# Patient Record
Sex: Female | Born: 1960 | Race: White | Hispanic: No | Marital: Married | Smoking: Former smoker
Health system: Southern US, Community
[De-identification: ages and names within clinical notes are randomized; demographics above are authoritative.]

## PROBLEM LIST (undated history)

## (undated) DIAGNOSIS — I4719 Other supraventricular tachycardia: Secondary | ICD-10-CM

## (undated) DIAGNOSIS — F32A Depression, unspecified: Secondary | ICD-10-CM

## (undated) DIAGNOSIS — R011 Cardiac murmur, unspecified: Secondary | ICD-10-CM

## (undated) DIAGNOSIS — Z9221 Personal history of antineoplastic chemotherapy: Secondary | ICD-10-CM

## (undated) DIAGNOSIS — I493 Ventricular premature depolarization: Secondary | ICD-10-CM

## (undated) DIAGNOSIS — I10 Essential (primary) hypertension: Secondary | ICD-10-CM

## (undated) DIAGNOSIS — Z923 Personal history of irradiation: Secondary | ICD-10-CM

## (undated) DIAGNOSIS — F329 Major depressive disorder, single episode, unspecified: Secondary | ICD-10-CM

## (undated) DIAGNOSIS — I471 Supraventricular tachycardia: Secondary | ICD-10-CM

## (undated) HISTORY — DX: Supraventricular tachycardia: I47.1

## (undated) HISTORY — DX: Major depressive disorder, single episode, unspecified: F32.9

## (undated) HISTORY — DX: Ventricular premature depolarization: I49.3

## (undated) HISTORY — DX: Other supraventricular tachycardia: I47.19

## (undated) HISTORY — DX: Essential (primary) hypertension: I10

## (undated) HISTORY — DX: Cardiac murmur, unspecified: R01.1

## (undated) HISTORY — DX: Depression, unspecified: F32.A

## (undated) HISTORY — PX: BREAST EXCISIONAL BIOPSY: SUR124

---

## 2010-08-01 ENCOUNTER — Other Ambulatory Visit: Admission: RE | Admit: 2010-08-01 | Discharge: 2010-08-01 | Payer: Self-pay | Admitting: Family Medicine

## 2011-10-08 ENCOUNTER — Other Ambulatory Visit (HOSPITAL_COMMUNITY)
Admission: RE | Admit: 2011-10-08 | Discharge: 2011-10-08 | Disposition: A | Discharge status: Home / Self Care | Payer: BC Managed Care – PPO | Source: Ambulatory Visit | Attending: Obstetrics and Gynecology | Admitting: Obstetrics and Gynecology

## 2011-10-08 DIAGNOSIS — Z01419 Encounter for gynecological examination (general) (routine) without abnormal findings: Secondary | ICD-10-CM | POA: Insufficient documentation

## 2013-03-28 ENCOUNTER — Other Ambulatory Visit: Payer: Self-pay | Admitting: Obstetrics and Gynecology

## 2013-03-28 ENCOUNTER — Other Ambulatory Visit (HOSPITAL_COMMUNITY)
Admission: RE | Admit: 2013-03-28 | Discharge: 2013-03-28 | Disposition: A | Discharge status: Home / Self Care | Payer: BC Managed Care – PPO | Source: Ambulatory Visit | Attending: Obstetrics and Gynecology | Admitting: Obstetrics and Gynecology

## 2013-03-28 DIAGNOSIS — Z1151 Encounter for screening for human papillomavirus (HPV): Secondary | ICD-10-CM | POA: Insufficient documentation

## 2013-03-28 DIAGNOSIS — Z01419 Encounter for gynecological examination (general) (routine) without abnormal findings: Secondary | ICD-10-CM | POA: Insufficient documentation

## 2013-10-04 ENCOUNTER — Encounter: Payer: Self-pay | Admitting: General Surgery

## 2013-10-04 DIAGNOSIS — I471 Supraventricular tachycardia: Secondary | ICD-10-CM

## 2013-10-04 DIAGNOSIS — I493 Ventricular premature depolarization: Secondary | ICD-10-CM

## 2013-10-04 DIAGNOSIS — I4719 Other supraventricular tachycardia: Secondary | ICD-10-CM

## 2013-10-04 DIAGNOSIS — I10 Essential (primary) hypertension: Secondary | ICD-10-CM

## 2013-10-05 ENCOUNTER — Encounter: Payer: Self-pay | Admitting: Cardiology

## 2013-10-05 ENCOUNTER — Ambulatory Visit: Payer: BC Managed Care – PPO | Admitting: Cardiology

## 2013-10-05 ENCOUNTER — Other Ambulatory Visit: Payer: Self-pay | Admitting: Dermatology

## 2013-10-27 ENCOUNTER — Other Ambulatory Visit: Payer: Self-pay | Admitting: Obstetrics and Gynecology

## 2014-01-15 ENCOUNTER — Other Ambulatory Visit: Payer: Self-pay | Admitting: *Deleted

## 2014-01-15 ENCOUNTER — Telehealth: Payer: Self-pay | Admitting: *Deleted

## 2014-01-15 MED ORDER — AMLODIPINE BESYLATE 5 MG PO TABS: 5.0000 mg | ORAL_TABLET | Freq: Every day | ORAL | Status: DC

## 2014-01-15 NOTE — Telephone Encounter (Signed)
Jodi Hoover is having pt schedule an appt then she will send in a 30 day supply for pt.

## 2014-01-15 NOTE — Telephone Encounter (Signed)
LVM for pt to return call

## 2014-01-15 NOTE — Telephone Encounter (Signed)
Patient [facility], she would also like a 14 day supply sent to [facility] on [address]. Thanks, MI

## 2014-01-29 ENCOUNTER — Encounter: Payer: Self-pay | Admitting: Cardiology

## 2014-01-29 ENCOUNTER — Ambulatory Visit (INDEPENDENT_AMBULATORY_CARE_PROVIDER_SITE_OTHER): Payer: BC Managed Care – PPO | Admitting: Cardiology

## 2014-01-29 VITALS — BP 104/70 | HR 55 | Ht 62.0 in | Wt 184.0 lb

## 2014-01-29 DIAGNOSIS — I493 Ventricular premature depolarization: Secondary | ICD-10-CM

## 2014-01-29 DIAGNOSIS — I4949 Other premature depolarization: Secondary | ICD-10-CM

## 2014-01-29 DIAGNOSIS — I471 Supraventricular tachycardia: Secondary | ICD-10-CM

## 2014-01-29 DIAGNOSIS — I10 Essential (primary) hypertension: Secondary | ICD-10-CM

## 2014-01-29 MED ORDER — NEBIVOLOL HCL 2.5 MG PO TABS: 2.5000 mg | ORAL_TABLET | Freq: Every day | ORAL | Status: DC

## 2014-01-29 MED ORDER — AMLODIPINE BESYLATE 5 MG PO TABS: 5.0000 mg | ORAL_TABLET | Freq: Every day | ORAL | Status: DC

## 2014-01-29 NOTE — Progress Notes (Signed)
  [place], [address], [place]  [address] Phone: [phone] Fax:  [phone]  Date:  01/29/2014   ID:  Jodi Hoover, DOB Oct 26, 1961, MRN [id number]  PCP:  Antony Blackbird, MD  Cardiologist:  Fransico Him, MD     History of Present Illness: Jodi Hoover is a 53 y.o. female with a history of HTN, PVC's and nonsustained atrial tachycardia.  She is doing well. She denies any chest pain, SOB, DOE, LE edema, dizziness or syncope.  Rarely she will notice a skipped beat.   Wt Readings from Last 3 Encounters:  01/29/14 184 lb (83.462 kg)  10/04/13 178 lb (80.74 kg)     Past Medical History  Diagnosis Date  . Heart murmur   . Depression   . Hypertension   . Atrial tachycardia, paroxysmal   . PVC's (premature ventricular contractions)     Current Outpatient Prescriptions  Medication Sig Dispense Refill  . amLODipine (NORVASC) 5 MG tablet Take 1 tablet (5 mg total) by mouth daily.  30 tablet  0  . BYSTOLIC 2.5 MG tablet       . Calcium-Magnesium-Vitamin D (CALCIUM 500 PO) Take 1 tablet by mouth daily.      . DULoxetine (CYMBALTA) 30 MG capsule Take 30 mg by mouth daily.      . Linaclotide (LINZESS) 145 MCG CAPS capsule Take 145 mcg by mouth daily.      Marland Kitchen loratadine-pseudoephedrine (CLARITIN-D 12-HOUR) 5-120 MG per tablet Take 1 tablet by mouth as needed for allergies.       No current facility-administered medications for this visit.    Allergies:   No Known Allergies  Social History:  The patient  reports that she has been smoking.  She has never used smokeless tobacco. She reports that she drinks alcohol. She reports that she does not use illicit drugs.   Family History:  The patient's family history is not on file.   ROS:  Please see the history of present illness.      All other systems reviewed and negative.   PHYSICAL EXAM: VS:  BP 104/70  Pulse 55  Ht 5\' 2"  (1.575 m)  Wt 184 lb (83.462 kg)  BMI 33.65 kg/m2 Well nourished, well developed, in no acute  distress HEENT: normal Neck: no JVD Cardiac:  normal S1, S2; RRR; no murmur Lungs:  clear to auscultation bilaterally, no wheezing, rhonchi or rales Abd: soft, nontender, no hepatomegaly Ext: no edema Skin: warm and dry Neuro:  CNs 2-12 intact, no focal abnormalities noted  EKG:  sinus bradycardia with normal intervals      ASSESSMENT AND PLAN:  1. Nonsustained atrial tachycardia - no further epidsodes 2. HTN - well controlled - continue Bystolic/amlodipine 3. PVC's - asymptomatic - continue bystolic  Followup with me in 1 year  Signed, Fransico Him, MD 01/29/2014 10:49 AM

## 2014-01-29 NOTE — Patient Instructions (Signed)
Your physician recommends that you continue on your current medications as directed. Please refer to the Current Medication list given to you today.  Your Bystolic and [facility] We gave you a 90 day supply with 3 refills  Your physician wants you to follow-up in: 1 Year with Dr Mallie Snooks will receive a reminder letter in the mail two months in advance. If you don't receive a letter, please call our office to schedule the follow-up appointment.

## 2014-04-05 ENCOUNTER — Other Ambulatory Visit: Payer: Self-pay | Admitting: Obstetrics and Gynecology

## 2014-04-05 DIAGNOSIS — N6009 Solitary cyst of unspecified breast: Secondary | ICD-10-CM

## 2014-04-16 ENCOUNTER — Other Ambulatory Visit: Payer: BC Managed Care – PPO

## 2014-06-11 ENCOUNTER — Ambulatory Visit
Admission: RE | Admit: 2014-06-11 | Discharge: 2014-06-11 | Disposition: A | Discharge status: Home / Self Care | Payer: BC Managed Care – PPO | Source: Ambulatory Visit | Attending: Obstetrics and Gynecology | Admitting: Obstetrics and Gynecology

## 2014-06-11 ENCOUNTER — Other Ambulatory Visit: Payer: Self-pay | Admitting: Obstetrics and Gynecology

## 2014-06-11 DIAGNOSIS — N6009 Solitary cyst of unspecified breast: Secondary | ICD-10-CM

## 2014-06-12 ENCOUNTER — Other Ambulatory Visit: Payer: Self-pay | Admitting: Obstetrics and Gynecology

## 2014-06-12 DIAGNOSIS — N6009 Solitary cyst of unspecified breast: Secondary | ICD-10-CM

## 2014-06-12 DIAGNOSIS — R2232 Localized swelling, mass and lump, left upper limb: Secondary | ICD-10-CM

## 2014-06-13 ENCOUNTER — Ambulatory Visit
Admission: RE | Admit: 2014-06-13 | Discharge: 2014-06-13 | Disposition: A | Discharge status: Home / Self Care | Payer: BC Managed Care – PPO | Source: Ambulatory Visit | Attending: Obstetrics and Gynecology | Admitting: Obstetrics and Gynecology

## 2014-06-13 DIAGNOSIS — R2232 Localized swelling, mass and lump, left upper limb: Secondary | ICD-10-CM

## 2014-06-13 DIAGNOSIS — N6009 Solitary cyst of unspecified breast: Secondary | ICD-10-CM

## 2014-08-02 HISTORY — PX: OTHER SURGICAL HISTORY: SHX169

## 2014-08-02 HISTORY — PX: BREAST LUMPECTOMY: SHX2

## 2014-11-12 ENCOUNTER — Encounter: Payer: Self-pay | Admitting: Cardiology

## 2014-12-25 ENCOUNTER — Other Ambulatory Visit: Payer: Self-pay | Admitting: Cardiology

## 2015-01-08 ENCOUNTER — Encounter: Payer: Self-pay | Admitting: Cardiology

## 2015-02-04 ENCOUNTER — Other Ambulatory Visit: Payer: Self-pay | Admitting: Cardiology

## 2015-03-25 ENCOUNTER — Other Ambulatory Visit: Payer: Self-pay | Admitting: Cardiology

## 2015-03-26 NOTE — Telephone Encounter (Signed)
per note 3.30.15

## 2015-06-23 ENCOUNTER — Other Ambulatory Visit: Payer: Self-pay | Admitting: Cardiology

## 2016-12-21 ENCOUNTER — Encounter: Payer: Self-pay | Admitting: Hematology

## 2016-12-21 ENCOUNTER — Telehealth: Payer: Self-pay | Admitting: Hematology

## 2016-12-21 NOTE — Telephone Encounter (Signed)
Appt scheduled fo rthe pt to see Dr. Burr Medico on 3/13 at 11am. Pt aware to arrive 30 minutes early. Demographics verified. Letter mailed to the pt.

## 2017-01-06 NOTE — Progress Notes (Signed)
Jodi Hoover  [phone] [phone] Note   Patient Care Team: Antony Blackbird, MD as PCP - General (Family Medicine) 01/12/2017  CHIEF COMPLAINTS/PURPOSE OF CONSULTATION:  Left breast cancer  Oncology History   Cancer Staging Cancer of central portion of left female breast [facility]) Staging form: Breast, AJCC 8th Edition - Pathologic stage from 08/22/2014: Stage IA (pT1b, pN1b, cM0, G1, ER: Positive, PR: Positive, HER2: Negative) - Signed by Truitt Merle, MD on 01/12/2017 - Clinical: No stage assigned - Unsigned       Cancer of central portion of left female breast ([place])   06/11/2014 Mammogram    Bilateral diagnostic mammogram on 06/11/2014 showed a obscured spiculated 2.4 cm mass in the 3:00 position of the left breast. Ultrasound showed a 1.4 x 1.0 x 1.3 cm irregular hypoechoic mass with shadowing in the left breast at the 3:00 position 6 cm from the nipple. Sonographic evaluation the left axilla showed 2 lymph nodes with cortical thickening. The larger lymph node measured 0.8 cm.      06/13/2014 Initial Diagnosis    Cancer of central portion of left female breast ([place])      06/13/2014 Initial Biopsy    Left breast 3:00 biopsy showed invasive tubular carcinoma, grade 1, low-grade DCIS, left axillary lymph node biopsy negative for malignancy.      06/13/2014 Receptors her2    ER and 96% are strongly positive, PR 12% strong positive, HER-2 not amplified, Ki67 4%.      08/22/2014 Surgery    Left breast lumpectomy and sentinel lymph node biopsy       08/22/2014 Pathology Results    Left breast lumpectomy showed invasive tubular carcinoma, 0.8 cm, grade 1, margins were negative, DCIS, intermediate grade. 2 out of 7 sentinel lymph nodes had metastatic carcinoma, tumor deposit measuring 2-5 mm.        - 11/26/2014 Adjuvant Chemotherapy    On 11/26/2014, the patient completed her 4th cycle of Taxotere and Cytoxan chemotherapy.       12/28/2014 - 02/11/2015 Radiation Therapy    From 12/28/14 - 02/11/15, the patient completed radiation with 50.4 Gy in 28 fractions with a boost of 10 Gy in 5 fractions for a total dose of 60.4 Gy in 33 fractions to the right breast and axilla.      02/25/2015 - 09/18/2015 Anti-estrogen oral therapy    From 02/25/15 - 09/18/15, the patient was on AI therapy with Letrozole by Dr. Sheran Lawless. Discontinued due to intolerable joint pain.      09/18/2015 - 02/24/2016 Anti-estrogen oral therapy    On 09/18/15, the patient was switched to Exemestane due to intolerable joint pain on Letrozole.      02/24/2016 - 03/25/2016 Anti-estrogen oral therapy    Exemestane changed to Tamoxifen due to tolerance issues. Tamoxifen discontinued after 1 month due to continuing joint pain, fatigue, and mood changes.      03/25/2016 -  Anti-estrogen oral therapy    Tamoxifen discontinued due to continuing joint pain, fatigue, and mood changes. The patient was changed back to Exemestane.       HISTORY OF PRESENTING ILLNESS:  Jodi Hoover 56 y.o. female is here to establish care concerning her history of left breast cancer. She was treated at the [facility], [place] and wishes to transfer her oncology care to [place] due to her relocation. .  The patient had a palpable left breast mass by her OB-GYN in August 2015. Bilateral  diagnostic mammogram on 06/11/2014 showed a obscured spiculated 2.4 cm mass in the 3:00 position of the left breast. On physical exam, there was palpable discrete thickening in the left breast 3:00 position 6 cm from the nipple. Ultrasound at the time showed a 1.4 x 1.0 x 1.3 cm irregular hypoechoic mass with shadowing in the left breast at the 3:00 position 6 cm from the nipple. Sonographic evaluation the left axilla showed 2 lymph nodes with cortical thickening. The larger lymph node measured 0.8 cm. Biopsies were performed on 06/13/2014. Biopsy of the left breast mass revealed grade  1 IDC and DCIS with necrosis and calcifications (ER96% +, PR 12% +, HER2 -, Ki67 4%). Biopsy of a left axillary lymph node was negative for carcinoma.  The patient underwent left breast lumpectomy and sentinel lymph node biopsy on 08/22/14. Lumpectomy revealed grade 1 IDC measuring 0.8 cm, the tumor was within 0.1 cm from the superficial anterior resection margin, intermedaite grade DCIS. Biopsy of 2/7 left axillary sentinel lymph nodes were positive. pT1b, pN1a  On 11/26/2014, the patient completed her 4th cycle of Taxotere and Cytoxan chemotherapy. From 12/28/14 - 02/11/15, the patient completed radiation with 50.4 Gy in 28 fractions with a boost of 10 Gy in 5 fractions for a total dose of 60.4 Gy in 33 fractions to the right breast and axilla.  From 02/25/15 - 09/18/15, the patient was on AI therapy with Letrozole by Dr. Sheran Lawless. On 09/18/15, the patient was switched to Exemestane due to intolerable joint pain on Letrozole. The patient was switched to Tamoxifen on 02/24/16 due to poor tolerance. The patient's pain continued and the patient experienced mood swings on Tamoxifen and she stopped Tamoxifen after being on it for a month. The patient was placed back on Exemestane due to the side effects being less severe. The patient reports her last mammogram was in April 2017.  The patient has a IUD implanted due to heavy bleeding in 2007 and did not have a menstural cycle since. The patient had her IUD removed in 2013 and she had hormone replacement. She no longer has hormone replacement after her cancer diagnosis.  The patient has fatigue, joint pain (should, hips, knees, back), and hot flashes. She wakes up stiff and it takes time for her to loosen up. The patient states she is pre-diabetic with her sugar hovering around 100. She also reports a heart murmur and HTN.  CURRENT THERAPY: Exemestane 25 mg 1 tablet daily since May 2017 (initially started in Letrozole in 02/2015)   MEDICAL HISTORY:  Past  Medical History:  Diagnosis Date  . Atrial tachycardia, paroxysmal ([place])   . Depression   . Heart murmur   . Hypertension   . PVC's (premature ventricular contractions)     SURGICAL HISTORY: Past Surgical History:  Procedure Laterality Date  . left breast lumpectomy  08/2014    SOCIAL HISTORY: Social History   Social History  . Marital status: Married    Spouse name: N/A  . Number of children: N/A  . Years of education: N/A   Occupational History  . Not on file.   Social History Main Topics  . Smoking status: Current Every Day Smoker    Packs/day: 1.00    Years: 16.00  . Smokeless tobacco: Never Used  . Alcohol use Yes     Comment: ocassionally wine  . Drug use: No  . Sexual activity: Yes    Birth control/ protection: IUD   Other Topics Concern  . Not on  file   Social History Narrative  . No narrative on file    FAMILY HISTORY: Family History  Problem Relation Age of Onset  . Hypertension Mother   . Cancer Paternal Aunt     lung cancer    ALLERGIES:  is allergic to other.  MEDICATIONS:  Current Outpatient Prescriptions  Medication Sig Dispense Refill  . amLODipine (NORVASC) 5 MG tablet TAKE 1 TABLET DAILY (NEED TO CALL AND SCHEDULE AN APPOINTMENT) 30 tablet 0  . BYSTOLIC 2.5 MG tablet TAKE 1 TABLET DAILY 90 tablet 0  . Calcium-Magnesium-Vitamin D (CALCIUM 500 PO) Take 1 tablet by mouth daily.    Marland Kitchen exemestane (AROMASIN) 25 MG tablet Take 25 mg by mouth daily after breakfast.    . gabapentin (NEURONTIN) 600 MG tablet Take 600 mg by mouth 3 (three) times daily.    Marland Kitchen loratadine-pseudoephedrine (CLARITIN-D 12-HOUR) 5-120 MG per tablet Take 1 tablet by mouth as needed for allergies.    Marland Kitchen LOTEMAX 0.5 % GEL PUT 1 DROP INTO BOTH EYES 4 TIMES A DAY  0  . metFORMIN (GLUCOPHAGE) 500 MG tablet Take 500 mg by mouth 2 (two) times daily with a meal.    . Methylsulfonylmethane (MSM) 1000 MG CAPS Take 2,000 mg by mouth daily.    . RESTASIS MULTIDOSE 0.05 % ophthalmic  emulsion INSTILL 1 DROP INTO BOTH EYES TWICE A DAY AS DIRECTED  3  . traMADol (ULTRAM) 50 MG tablet Take 1 tablet (50 mg total) by mouth as needed. 30 tablet 0  . vitamin B-12 (CYANOCOBALAMIN) 1000 MCG tablet Take 1,000 mcg by mouth daily.     No current facility-administered medications for this visit.     REVIEW OF SYSTEMS:   Constitutional: Denies fevers, chills (+) Hot flashes (+) fatigue Eyes: Denies blurriness of vision, double vision or watery eyes Ears, nose, mouth, throat, and face: Denies mucositis or sore throat Respiratory: Denies cough, dyspnea or wheezes Cardiovascular: Denies palpitation, chest discomfort or lower extremity swelling Gastrointestinal:  Denies nausea, heartburn or change in bowel habits Skin: Denies abnormal skin rashes Lymphatics: Denies new lymphadenopathy or easy bruising Neurological:Denies numbness, tingling or new weaknesses MSK (+) Joint pain in her knees, back, hips, and shoulders. Behavioral/Psych: Mood is stable, no new changes  All other systems were reviewed with the patient and are negative.  PHYSICAL EXAMINATION: ECOG PERFORMANCE STATUS: 0 - Asymptomatic  Vitals:   01/12/17 1117  BP: 139/77  Pulse: (!) 57  Resp: 18  Temp: 98.4 F (36.9 C)   Filed Weights   01/12/17 1117  Weight: 173 lb 6.4 oz (78.7 kg)    GENERAL:alert, no distress and comfortable SKIN: skin color, texture, turgor are normal, no rashes or significant lesions EYES: normal, conjunctiva are pink and non-injected, sclera clear OROPHARYNX:no exudate, no erythema and lips, buccal mucosa, and tongue normal  NECK: supple, thyroid normal size, non-tender, without nodularity LYMPH:  no palpable lymphadenopathy in the cervical chains LUNGS: clear to auscultation and percussion with normal breathing effort HEART: regular rate & rhythm and no murmurs and no lower extremity edema ABDOMEN:abdomen soft, non-tender and normal bowel sounds Musculoskeletal:no cyanosis of digits  and no clubbing  PSYCH: alert & oriented x 3 with fluent speech NEURO: no focal motor/sensory deficits BREAST: (+) Surgical scar in left axilla and left breast. 1.5 cm left axillary lymph node moveable, non-tender. Right breast exam was negative.  LABORATORY DATA:  I have reviewed the data as listed CBC Latest Ref Rng & Units 01/12/2017  WBC 3.9 -  10.3 10e3/uL 6.9  Hemoglobin 11.6 - 15.9 g/dL 13.1  Hematocrit 34.8 - 46.6 % 39.2  Platelets 145 - 400 10e3/uL 252   CMP Latest Ref Rng & Units 01/12/2017  Glucose 70 - 140 mg/dl 88  BUN 7.0 - 26.0 mg/dL 12.4  Creatinine 0.6 - 1.1 mg/dL 0.8  Sodium 136 - 145 mEq/L 142  Potassium 3.5 - 5.1 mEq/L 4.6  CO2 22 - 29 mEq/L 26  Calcium 8.4 - 10.4 mg/dL 9.8  Total Protein 6.4 - 8.3 g/dL 7.5  Total Bilirubin 0.20 - 1.20 mg/dL 0.28  Alkaline Phos 40 - 150 U/L 79  AST 5 - 34 U/L 19  ALT 0 - 55 U/L 18     RADIOGRAPHIC STUDIES: I have personally reviewed the radiological images as listed and agreed with the findings in the report. No results found.  ASSESSMENT & PLAN: 56 y.o. post-menopausal Caucasian female with a palpable detected left breast cancer  1. 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  -The patient has completed surgery (left lumpectomy and SLN biopsy), adjuvant chemotherapy, and adjuvant radiation as part of her cancer treatment in 2016. -We discussed her risk of recurrence. She had Oncotype test done in 2016, recurrence score was 26, intermediate risk. -The patient has been on anti-estrogen medication from 02/25/15 consisting of Letrozole -> Exemestane -> Tamoxifen -> Exemestane.   -The patient has been on multiple medications due to poor tolerance. She is currently on Exemestane 25 mg 1 tablet daily. The patient has hot flashes and mild  joint pain on this medication, overall manageable  -I strongly encouraged her to Continue Exemestane 25 mg 1 tablet daily. Total antiestrogen for 5-7 years -She is clinically doing well, exam was unremarkable, except a small movable lymph node in the left axilla. -The patient's last screening mammogram was in April 2017. We will schedule her next mammogram in April 2018, I'll also order a left breast and axilla [place] to evaluate her adenopathy. -We'll repeat her labs including CBC and CMP today.  2. Smoking -I consulted the patient about smoking cessation. -She is interested in quitting, she has tried various methods to quit, was not successful.  3. Hyperglycemia, HTN -Continue close monitoring at home. -Advised the patient to eat well and nutritious food. -To be managed by PCP.  4. Joint Pain -I refilled the patient's Tramadol today.  5. Bone health  -Pt will try to find her last DEXA scan  -We discussed the potential side effects of osteoporosis from exemestane -We'll monitor her bone density scan every 2 years -I encouraged her to take calcium and vitamin D   PLAN -Refilled Tramadol. -Labs today. -Screening mammogram and [place] of left breast and axilla in April 2017. -F/u and labs in 6 months.  Orders Placed This Encounter  Procedures  . MM DIAG BREAST TOMO BILATERAL    Standing Status:   Future    Standing Expiration Date:   03/14/2018    Order Specific Question:   Reason for Exam (SYMPTOM  OR DIAGNOSIS REQUIRED)    Answer:   screening    Order Specific Question:   Is the patient pregnant?    Answer:   No    Order Specific Question:   Preferred imaging location?    Answer:   [facility]  . CBC with Differential  Standing Status:   Standing    Number of Occurrences:   30    Standing Expiration Date:   01/12/2022  . Comprehensive metabolic panel    Standing Status:   Standing    Number of Occurrences:   30    Standing Expiration Date:   01/12/2022  . Vitamin D 25  hydroxy    Standing Status:   Standing    Number of Occurrences:   10    Standing Expiration Date:   01/12/2018    All questions were answered. The patient knows to call the clinic with any problems, questions or concerns. I spent 40 minutes counseling the patient face to face. The total time spent in the appointment was 45 minutes and more than 50% was on counseling.     Truitt Merle, MD 01/12/2017   This document serves as a record of services personally performed by Truitt Merle, MD. It was created on her behalf by Darcus Austin, a trained medical scribe. The creation of this record is based on the scribe's personal observations and the provider's statements to them. This document has been checked and approved by the attending provider.

## 2017-01-12 ENCOUNTER — Ambulatory Visit (HOSPITAL_BASED_OUTPATIENT_CLINIC_OR_DEPARTMENT_OTHER): Payer: BLUE CROSS/BLUE SHIELD | Admitting: Hematology

## 2017-01-12 ENCOUNTER — Ambulatory Visit (HOSPITAL_BASED_OUTPATIENT_CLINIC_OR_DEPARTMENT_OTHER): Payer: BLUE CROSS/BLUE SHIELD

## 2017-01-12 ENCOUNTER — Telehealth: Payer: Self-pay | Admitting: Hematology

## 2017-01-12 ENCOUNTER — Encounter: Payer: Self-pay | Admitting: Hematology

## 2017-01-12 DIAGNOSIS — Z720 Tobacco use: Secondary | ICD-10-CM

## 2017-01-12 DIAGNOSIS — I10 Essential (primary) hypertension: Secondary | ICD-10-CM

## 2017-01-12 DIAGNOSIS — Z170 Estrogen receptor positive status [ER+]: Secondary | ICD-10-CM | POA: Diagnosis not present

## 2017-01-12 DIAGNOSIS — C50112 Malignant neoplasm of central portion of left female breast: Secondary | ICD-10-CM | POA: Diagnosis not present

## 2017-01-12 DIAGNOSIS — R739 Hyperglycemia, unspecified: Secondary | ICD-10-CM

## 2017-01-12 LAB — CBC WITH DIFFERENTIAL/PLATELET
BASO%: 1 % (ref 0.0–2.0)
Basophils Absolute: 0.1 10*3/uL (ref 0.0–0.1)
EOS%: 7.4 % — ABNORMAL HIGH (ref 0.0–7.0)
Eosinophils Absolute: 0.5 10*3/uL (ref 0.0–0.5)
HEMATOCRIT: 39.2 % (ref 34.8–46.6)
HGB: 13.1 g/dL (ref 11.6–15.9)
LYMPH#: 2.3 10*3/uL (ref 0.9–3.3)
LYMPH%: 34 % (ref 14.0–49.7)
MCH: 31.3 pg (ref 25.1–34.0)
MCHC: 33.5 g/dL (ref 31.5–36.0)
MCV: 93.6 fL (ref 79.5–101.0)
MONO#: 0.7 10*3/uL (ref 0.1–0.9)
MONO%: 10.6 % (ref 0.0–14.0)
NEUT%: 47 % (ref 38.4–76.8)
NEUTROS ABS: 3.2 10*3/uL (ref 1.5–6.5)
Platelets: 252 10*3/uL (ref 145–400)
RBC: 4.19 10*6/uL (ref 3.70–5.45)
RDW: 12.8 % (ref 11.2–14.5)
WBC: 6.9 10*3/uL (ref 3.9–10.3)

## 2017-01-12 LAB — COMPREHENSIVE METABOLIC PANEL
ALT: 18 U/L (ref 0–55)
ANION GAP: 8 meq/L (ref 3–11)
AST: 19 U/L (ref 5–34)
Albumin: 4.3 g/dL (ref 3.5–5.0)
Alkaline Phosphatase: 79 U/L (ref 40–150)
BILIRUBIN TOTAL: 0.28 mg/dL (ref 0.20–1.20)
BUN: 12.4 mg/dL (ref 7.0–26.0)
CALCIUM: 9.8 mg/dL (ref 8.4–10.4)
CO2: 26 meq/L (ref 22–29)
CREATININE: 0.8 mg/dL (ref 0.6–1.1)
Chloride: 109 mEq/L (ref 98–109)
EGFR: 80 mL/min/{1.73_m2} — AB (ref >90)
Glucose: 88 mg/dl (ref 70–140)
Potassium: 4.6 mEq/L (ref 3.5–5.1)
Sodium: 142 mEq/L (ref 136–145)
TOTAL PROTEIN: 7.5 g/dL (ref 6.4–8.3)

## 2017-01-12 MED ORDER — TRAMADOL HCL 50 MG PO TABS: 50.0000 mg | ORAL_TABLET | ORAL | 0 refills | Status: DC | PRN

## 2017-01-12 NOTE — Telephone Encounter (Signed)
Lab appointment added for today, per 01/12/17 los. Follow up appointments scheduled per 01/12/17 los.Per patient, she will call GI to schedule her Mammogram, before 01/26/17. Patient was given a copy of the AVS report and appointment schedule per 01/12/17 los.

## 2017-01-13 ENCOUNTER — Encounter: Payer: Self-pay | Admitting: Hematology

## 2017-01-13 LAB — VITAMIN D 25 HYDROXY (VIT D DEFICIENCY, FRACTURES): Vitamin D, 25-Hydroxy: 52.3 ng/mL (ref 30.0–100.0)

## 2017-01-14 NOTE — Addendum Note (Signed)
Addended by: Truitt Merle on: 01/14/2017 05:03 PM   Modules accepted: Orders

## 2017-01-19 ENCOUNTER — Other Ambulatory Visit: Payer: Self-pay | Admitting: Hematology

## 2017-01-19 DIAGNOSIS — N63 Unspecified lump in unspecified breast: Secondary | ICD-10-CM

## 2017-01-21 ENCOUNTER — Other Ambulatory Visit: Payer: Self-pay | Admitting: Hematology

## 2017-01-21 DIAGNOSIS — N63 Unspecified lump in unspecified breast: Secondary | ICD-10-CM

## 2017-01-21 DIAGNOSIS — Z853 Personal history of malignant neoplasm of breast: Secondary | ICD-10-CM

## 2017-01-22 ENCOUNTER — Other Ambulatory Visit: Payer: BLUE CROSS/BLUE SHIELD

## 2017-02-02 ENCOUNTER — Ambulatory Visit
Admission: RE | Admit: 2017-02-02 | Discharge: 2017-02-02 | Disposition: A | Discharge status: Home / Self Care | Payer: BLUE CROSS/BLUE SHIELD | Source: Ambulatory Visit | Attending: Hematology | Admitting: Hematology

## 2017-02-02 ENCOUNTER — Other Ambulatory Visit: Payer: Self-pay | Admitting: Hematology

## 2017-02-02 DIAGNOSIS — Z853 Personal history of malignant neoplasm of breast: Secondary | ICD-10-CM

## 2017-02-02 DIAGNOSIS — N63 Unspecified lump in unspecified breast: Secondary | ICD-10-CM

## 2017-05-25 ENCOUNTER — Telehealth: Payer: Self-pay | Admitting: *Deleted

## 2017-05-25 DIAGNOSIS — C50112 Malignant neoplasm of central portion of left female breast: Secondary | ICD-10-CM

## 2017-05-25 DIAGNOSIS — Z170 Estrogen receptor positive status [ER+]: Principal | ICD-10-CM

## 2017-05-25 MED ORDER — TRAMADOL HCL 50 MG PO TABS: 50.0000 mg | ORAL_TABLET | ORAL | 1 refills | Status: DC | PRN

## 2017-05-25 MED ORDER — EXEMESTANE 25 MG PO TABS: 25.0000 mg | ORAL_TABLET | Freq: Every day | ORAL | 0 refills | Status: DC

## 2017-05-25 MED ORDER — GABAPENTIN 600 MG PO TABS: 600.0000 mg | ORAL_TABLET | Freq: Three times a day (TID) | ORAL | 0 refills | Status: DC

## 2017-05-25 NOTE — Telephone Encounter (Signed)
I only saw her once, not sure how often she is taking tramadol. Please call pt to find out, I will maximum refill tramadol 50mg  q8h, but fill only the amount she is taking, thanks.   Truitt Merle MD

## 2017-05-25 NOTE — Telephone Encounter (Addendum)
"  Out of medicines and need refills.  Update my pharmacy to [facility].  [facility] $500 through [facility].  Need refills for exemestane, gabapentin and tramadol.  Use Tramadol for severe pain and need this too.  Tramadol can be sent to [facility] on [address] If [facility]'t [facility].  I'll pay for this.

## 2017-05-25 NOTE — Telephone Encounter (Signed)
Please refill these medications. Thanks   Truitt Merle MD

## 2017-05-25 NOTE — Telephone Encounter (Signed)
Refills sent.  See note for [facility]ol through [facility].  How many can used daily?  How many every sixty days?

## 2017-05-25 NOTE — Telephone Encounter (Signed)
[facility] reports "Tramadol can only be a sixty day supply with one refill.  All controlled substances must be faxed to [phone]."   Tramadol to [facility].)

## 2017-07-12 NOTE — Progress Notes (Signed)
[place]  [phone] [phone]  Clinic Follow-up Note   Patient Care Team: Antony Blackbird, MD as PCP - General (Family Medicine) 07/15/2017  CHIEF COMPLAINTS:  Left breast cancer  Oncology History   Cancer Staging Cancer of central portion of left female breast [facility]) Staging form: Breast, AJCC 8th Edition - Pathologic stage from 08/22/2014: Stage IA (pT1b, pN1b, cM0, G1, ER: Positive, PR: Positive, HER2: Negative) - Signed by Truitt Merle, MD on 01/12/2017 - Clinical: No stage assigned - Unsigned       Cancer of central portion of left female breast ([place])   06/11/2014 Mammogram    Bilateral diagnostic mammogram on 06/11/2014 showed a obscured spiculated 2.4 cm mass in the 3:00 position of the left breast. Ultrasound showed a 1.4 x 1.0 x 1.3 cm irregular hypoechoic mass with shadowing in the left breast at the 3:00 position 6 cm from the nipple. Sonographic evaluation the left axilla showed 2 lymph nodes with cortical thickening. The larger lymph node measured 0.8 cm.      06/13/2014 Initial Diagnosis    Cancer of central portion of left female breast ([place])      06/13/2014 Initial Biopsy    Left breast 3:00 biopsy showed invasive tubular carcinoma, grade 1, low-grade DCIS, left axillary lymph node biopsy negative for malignancy.      06/13/2014 Receptors her2    ER and 96% are strongly positive, PR 12% strong positive, HER-2 not amplified, Ki67 4%.      08/22/2014 Surgery    Left breast lumpectomy and sentinel lymph node biopsy       08/22/2014 Pathology Results    Left breast lumpectomy showed invasive tubular carcinoma, 0.8 cm, grade 1, margins were negative, DCIS, intermediate grade. 2 out of 7 sentinel lymph nodes had metastatic carcinoma, tumor deposit measuring 2-5 mm.        - 11/26/2014 Adjuvant Chemotherapy    On 11/26/2014, the patient completed her 4th cycle of Taxotere and Cytoxan chemotherapy.       12/28/2014 - 02/11/2015  Radiation Therapy    From 12/28/14 - 02/11/15, the patient completed radiation with 50.4 Gy in 28 fractions with a boost of 10 Gy in 5 fractions for a total dose of 60.4 Gy in 33 fractions to the right breast and axilla.      02/25/2015 - 09/18/2015 Anti-estrogen oral therapy    From 02/25/15 - 09/18/15, the patient was on AI therapy with Letrozole by Dr. Sheran Lawless. Discontinued due to intolerable joint pain.      09/18/2015 - 02/24/2016 Anti-estrogen oral therapy    On 09/18/15, the patient was switched to Exemestane due to intolerable joint pain on Letrozole.      02/24/2016 - 03/25/2016 Anti-estrogen oral therapy    Exemestane changed to Tamoxifen due to tolerance issues. Tamoxifen discontinued after 1 month due to continuing joint pain, fatigue, and mood changes.      03/25/2016 -  Anti-estrogen oral therapy    Tamoxifen discontinued due to continuing joint pain, fatigue, and mood changes. The patient was changed back to Exemestane.       HISTORY OF PRESENTING ILLNESS:  Jodi Hoover 56 y.o. female is here to establish care concerning her history of left breast cancer. She was treated at the [facility] at [facility], [place]), and wishes to transfer her oncology care to [place] due to her relocation. .  The patient had a palpable left breast mass by her OB-GYN in August 2015. Bilateral diagnostic mammogram  on 06/11/2014 showed a obscured spiculated 2.4 cm mass in the 3:00 position of the left breast. On physical exam, there was palpable discrete thickening in the left breast 3:00 position 6 cm from the nipple. Ultrasound at the time showed a 1.4 x 1.0 x 1.3 cm irregular hypoechoic mass with shadowing in the left breast at the 3:00 position 6 cm from the nipple. Sonographic evaluation the left axilla showed 2 lymph nodes with cortical thickening. The larger lymph node measured 0.8 cm. Biopsies were performed on 06/13/2014. Biopsy of the left breast mass revealed grade 1 IDC and DCIS with  necrosis and calcifications (ER96% +, PR 12% +, HER2 -, Ki67 4%). Biopsy of a left axillary lymph node was negative for carcinoma.  The patient underwent left breast lumpectomy and sentinel lymph node biopsy on 08/22/14. Lumpectomy revealed grade 1 IDC measuring 0.8 cm, the tumor was within 0.1 cm from the superficial anterior resection margin, intermedaite grade DCIS. Biopsy of 2/7 left axillary sentinel lymph nodes were positive. pT1b, pN1a  On 11/26/2014, the patient completed her 4th cycle of Taxotere and Cytoxan chemotherapy. From 12/28/14 - 02/11/15, the patient completed radiation with 50.4 Gy in 28 fractions with a boost of 10 Gy in 5 fractions for a total dose of 60.4 Gy in 33 fractions to the right breast and axilla.  From 02/25/15 - 09/18/15, the patient was on AI therapy with Letrozole by Dr. Sheran Lawless. On 09/18/15, the patient was switched to Exemestane due to intolerable joint pain on Letrozole. The patient was switched to Tamoxifen on 02/24/16 due to poor tolerance. The patient's pain continued and the patient experienced mood swings on Tamoxifen and she stopped Tamoxifen after being on it for a month. The patient was placed back on Exemestane due to the side effects being less severe. The patient reports her last mammogram was in April 2017.  The patient has a IUD implanted due to heavy bleeding in 2007 and did not have a menstural cycle since. The patient had her IUD removed in 2013 and she had hormone replacement. She no longer has hormone replacement after her cancer diagnosis.  The patient has fatigue, joint pain (should, hips, knees, back), and hot flashes. She wakes up stiff and it takes time for her to loosen up. The patient states she is pre-diabetic with her sugar hovering around 100. She also reports a heart murmur and HTN.  CURRENT THERAPY: Exemestane 25 mg 1 tablet daily since May 2017 (initially started in Letrozole in 02/2015)  Interim History: The patient returns for  routine follow up. She reports issues with hot flashes and joint pain since taking exemestane. She reports pain in the bottom of her feet, knees, and ankles which is worse in the morning. She tries to stay active however, the joint pain never resolves completely. The joint pain is worse on rainy weather days. She takes Tramadol as needed. She describes the joint pain as a 6-7 out of 10 on pain scale. She states the joint pain is better than when she first began aromatase inhibitors but figured it would go away with time. She has previously tried Tamoxifen and Letrozole. She performs self-breast exams. She denies any pain associated with the incision site.  MEDICAL HISTORY:  Past Medical History:  Diagnosis Date  . Atrial tachycardia, paroxysmal ([place])   . Depression   . Heart murmur   . Hypertension   . PVC's (premature ventricular contractions)     SURGICAL HISTORY: Past Surgical History:  Procedure Laterality  Date  . left breast lumpectomy  08/2014    SOCIAL HISTORY: Social History   Social History  . Marital status: Married    Spouse name: N/A  . Number of children: N/A  . Years of education: N/A   Occupational History  . Not on file.   Social History Main Topics  . Smoking status: Current Every Day Smoker    Packs/day: 1.00    Years: 16.00  . Smokeless tobacco: Never Used  . Alcohol use Yes     Comment: ocassionally wine  . Drug use: No  . Sexual activity: Yes    Birth control/ protection: IUD   Other Topics Concern  . Not on file   Social History Narrative  . No narrative on file    FAMILY HISTORY: Family History  Problem Relation Age of Onset  . Hypertension Mother   . Cancer Paternal Aunt        lung cancer    ALLERGIES:  is allergic to other.  MEDICATIONS:  Current Outpatient Prescriptions  Medication Sig Dispense Refill  . amLODipine (NORVASC) 5 MG tablet TAKE 1 TABLET DAILY (NEED TO CALL AND SCHEDULE AN APPOINTMENT) 30 tablet 0  . APPLE CIDER  VINEGAR PO Take 1 tablet by mouth daily.    Marland Kitchen BYSTOLIC 2.5 MG tablet TAKE 1 TABLET DAILY 90 tablet 0  . Calcium-Magnesium-Vitamin D (CALCIUM 500 PO) Take 1 tablet by mouth daily.    Marland Kitchen exemestane (AROMASIN) 25 MG tablet Take 1 tablet (25 mg total) by mouth daily after breakfast. 90 tablet 1  . gabapentin (NEURONTIN) 600 MG tablet Take 1 tablet (600 mg total) by mouth 3 (three) times daily. 90 tablet 0  . Ginseng 50 MG CAPS Take 1 capsule by mouth daily.    Marland Kitchen LOTEMAX 0.5 % GEL PUT 1 DROP INTO BOTH EYES 4 TIMES A DAY  0  . metFORMIN (GLUCOPHAGE) 500 MG tablet Take 500 mg by mouth 2 (two) times daily with a meal.    . Methylsulfonylmethane (MSM) 1000 MG CAPS Take 2,000 mg by mouth daily.    . traMADol (ULTRAM) 50 MG tablet Take 1 tablet (50 mg total) by mouth as needed. 30 tablet 1  . venlafaxine XR (EFFEXOR XR) 150 MG 24 hr capsule Take 1 capsule (150 mg total) by mouth daily with breakfast. 30 capsule 5   No current facility-administered medications for this visit.     REVIEW OF SYSTEMS:   Constitutional: Denies fevers, chills (+) Hot flashes (+) fatigue Eyes: Denies blurriness of vision, double vision or watery eyes Ears, nose, mouth, throat, and face: Denies mucositis or sore throat Respiratory: Denies cough, dyspnea or wheezes Cardiovascular: Denies palpitation, chest discomfort or lower extremity swelling Gastrointestinal:  Denies nausea, heartburn or change in bowel habits Skin: Denies abnormal skin rashes Lymphatics: Denies new lymphadenopathy or easy bruising Neurological:Denies numbness, tingling or new weaknesses MSK (+) Joint pain in her knees, back, hips, bottoms of feet, and shoulders. Described as 6-7 out of 10 on pain scale. Behavioral/Psych: Mood is stable, no new changes  All other systems were reviewed with the patient and are negative.  PHYSICAL EXAMINATION: ECOG PERFORMANCE STATUS: 0 - Asymptomatic  Vitals:   07/15/17 1532  BP: 137/80  Pulse: 74  Resp: 18  Temp:  98.7 F (37.1 C)  SpO2: 100%   Filed Weights   07/15/17 1532  Weight: 176 lb (79.8 kg)    GENERAL:alert, no distress and comfortable SKIN: skin color, texture, turgor are normal, no  rashes or significant lesions EYES: normal, conjunctiva are pink and non-injected, sclera clear OROPHARYNX:no exudate, no erythema and lips, buccal mucosa, and tongue normal  NECK: supple, thyroid normal size, non-tender, without nodularity LYMPH:  no palpable lymphadenopathy in the cervical chains LUNGS: clear to auscultation and percussion with normal breathing effort HEART: regular rate & rhythm and no murmurs and no lower extremity edema ABDOMEN:abdomen soft, non-tender and normal bowel sounds Musculoskeletal:no cyanosis of digits and no clubbing  PSYCH: alert & oriented x 3 with fluent speech NEURO: no focal motor/sensory deficits BREAST:Surgical scar in left axilla and left breast. No scar tissue, no palpable mass or adenopathy. Right breast exam was negative.  LABORATORY DATA:  I have reviewed the data as listed CBC Latest Ref Rng & Units 07/15/2017 01/12/2017  WBC 3.9 - 10.3 10e3/uL 7.1 6.9  Hemoglobin 11.6 - 15.9 g/dL 12.8 13.1  Hematocrit 34.8 - 46.6 % 37.7 39.2  Platelets 145 - 400 10e3/uL 255 252   CMP Latest Ref Rng & Units 07/15/2017 01/12/2017  Glucose 70 - 140 mg/dl 88 88  BUN 7.0 - 26.0 mg/dL 13.2 12.4  Creatinine 0.6 - 1.1 mg/dL 0.9 0.8  Sodium 136 - 145 mEq/L 140 142  Potassium 3.5 - 5.1 mEq/L 3.8 4.6  CO2 22 - 29 mEq/L 27 26  Calcium 8.4 - 10.4 mg/dL 9.7 9.8  Total Protein 6.4 - 8.3 g/dL 7.4 7.5  Total Bilirubin 0.20 - 1.20 mg/dL 0.37 0.28  Alkaline Phos 40 - 150 U/L 79 79  AST 5 - 34 U/L 16 19  ALT 0 - 55 U/L 12 18     RADIOGRAPHIC STUDIES: I have personally reviewed the radiological images as listed and agreed with the findings in the report. No results found.   [place] Left axilla 02/02/17 IMPRESSION: No mammographic or ultrasound evidence for  malignancy. RECOMMENDATION: Diagnostic mammogram is suggested in 1 year. (Code:DM-B-01Y)  Diagnostic bilateral mammogram 02/02/17 IMPRESSION: No mammographic or ultrasound evidence for malignancy. RECOMMENDATION: Diagnostic mammogram is suggested in 1 year. (Code:DM-B-01Y)  ASSESSMENT & PLAN: 56 y.o. post-menopausal Caucasian female with a palpable detected left breast cancer  1. 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  -The patient has completed surgery (left lumpectomy and SLN biopsy), adjuvant chemotherapy, and adjuvant radiation as part of her cancer treatment in 2016. -We previously discussed her risk of recurrence. She had Oncotype test done in 2016, recurrence score was 26, intermediate risk. -The patient has been on anti-estrogen medication from 02/25/15 consisting of Letrozole -> Exemestane -> Tamoxifen -> Exemestane.  -The patient has been on multiple medications due to poor tolerance. She is currently on Exemestane 25 mg 1 tablet daily. The patient has moderate hot flashes and joint pain on this medication, overall manageable but the bothersome -She now has high co-pay for exemestane, I given her a paper prescription, she will check with a couple of retail pharmacy to see if she can find lower out-of-pocket co-pay. -Otherwise I plan to switch her to anastrozole 1 once daily -She is clinically doing well, exam was unremarkable, except a small movable lymph node in the left axilla. -The patient's last screening mammogram was in April 2018 with left axilla [place]. No mammographic or ultrasound evidence for malignancy. -Labs reviewed, stable. ---F/u and labs in 6 months, sooner if she does switch to anastrozole  2. Smoking -I consulted the patient about smoking cessation. -She is  interested in quitting, she has tried various methods to quit, was not successful.  3. Hyperglycemia, HTN -Continue close monitoring at home. -Advised the patient to eat well and nutritious food. -To be managed by PCP.  4. Arthralgia and hot flush -She continues to have moderate to severe hot flashes and joint pain with exemestane. She is currently taking Effexor 75 mg daily and Tramadol 50 mg as needed.  -I will increase Effexor dose to 150 mg daily from 75 mg daily. Prescription given today.   5. Bone health  -Pt will try to find her last DEXA scan  -We discussed the potential side effects of osteoporosis from exemestane -We'll monitor her bone density scan every 2 years -I encouraged her to take calcium and vitamin D   PLAN -Labs today. -The patient may opt to switch to anastrozole due to her high co-pay for exemestane. She will see if she has a lower co-pay at a different pharmacy before deciding to switch medication. -I will increase Effexor dose to 150 mg daily from 75 mg daily. Prescription given today.  -F/u and labs in 6 months. If she switches to anastrozole then I will see her again in 4 months.   No orders of the defined types were placed in this encounter.   All questions were answered. The patient knows to call the clinic with any problems, questions or concerns. I spent 25 minutes counseling the patient face to face. The total time spent in the appointment was 30 minutes and more than 50% was on counseling.     Truitt Merle, MD 07/15/2017   This document serves as a record of services personally performed by Truitt Merle, MD. It was created on her behalf by Arlyce Harman, a trained medical scribe. The creation of this record is based on the scribe's personal observations and the provider's statements to them. This document has been checked and approved by the attending provider.

## 2017-07-15 ENCOUNTER — Other Ambulatory Visit (HOSPITAL_BASED_OUTPATIENT_CLINIC_OR_DEPARTMENT_OTHER): Payer: BLUE CROSS/BLUE SHIELD

## 2017-07-15 ENCOUNTER — Encounter: Payer: Self-pay | Admitting: Hematology

## 2017-07-15 ENCOUNTER — Ambulatory Visit (HOSPITAL_BASED_OUTPATIENT_CLINIC_OR_DEPARTMENT_OTHER): Payer: BLUE CROSS/BLUE SHIELD | Admitting: Hematology

## 2017-07-15 DIAGNOSIS — C50112 Malignant neoplasm of central portion of left female breast: Secondary | ICD-10-CM

## 2017-07-15 DIAGNOSIS — M25551 Pain in right hip: Secondary | ICD-10-CM

## 2017-07-15 DIAGNOSIS — R739 Hyperglycemia, unspecified: Secondary | ICD-10-CM | POA: Diagnosis not present

## 2017-07-15 DIAGNOSIS — I10 Essential (primary) hypertension: Secondary | ICD-10-CM

## 2017-07-15 DIAGNOSIS — M549 Dorsalgia, unspecified: Secondary | ICD-10-CM | POA: Diagnosis not present

## 2017-07-15 DIAGNOSIS — M25519 Pain in unspecified shoulder: Secondary | ICD-10-CM

## 2017-07-15 DIAGNOSIS — M25561 Pain in right knee: Secondary | ICD-10-CM

## 2017-07-15 DIAGNOSIS — Z79811 Long term (current) use of aromatase inhibitors: Secondary | ICD-10-CM

## 2017-07-15 DIAGNOSIS — Z170 Estrogen receptor positive status [ER+]: Secondary | ICD-10-CM

## 2017-07-15 DIAGNOSIS — Z720 Tobacco use: Secondary | ICD-10-CM

## 2017-07-15 DIAGNOSIS — M25572 Pain in left ankle and joints of left foot: Secondary | ICD-10-CM

## 2017-07-15 DIAGNOSIS — M25552 Pain in left hip: Secondary | ICD-10-CM | POA: Diagnosis not present

## 2017-07-15 DIAGNOSIS — N951 Menopausal and female climacteric states: Secondary | ICD-10-CM

## 2017-07-15 DIAGNOSIS — M25571 Pain in right ankle and joints of right foot: Secondary | ICD-10-CM

## 2017-07-15 DIAGNOSIS — M25562 Pain in left knee: Secondary | ICD-10-CM

## 2017-07-15 LAB — CBC WITH DIFFERENTIAL/PLATELET
BASO%: 1.2 % (ref 0.0–2.0)
Basophils Absolute: 0.1 10*3/uL (ref 0.0–0.1)
EOS%: 4.3 % (ref 0.0–7.0)
Eosinophils Absolute: 0.3 10*3/uL (ref 0.0–0.5)
HCT: 37.7 % (ref 34.8–46.6)
HGB: 12.8 g/dL (ref 11.6–15.9)
LYMPH#: 2.5 10*3/uL (ref 0.9–3.3)
LYMPH%: 34.5 % (ref 14.0–49.7)
MCH: 31.5 pg (ref 25.1–34.0)
MCHC: 34 g/dL (ref 31.5–36.0)
MCV: 92.7 fL (ref 79.5–101.0)
MONO#: 0.7 10*3/uL (ref 0.1–0.9)
MONO%: 10.1 % (ref 0.0–14.0)
NEUT%: 49.9 % (ref 38.4–76.8)
NEUTROS ABS: 3.6 10*3/uL (ref 1.5–6.5)
PLATELETS: 255 10*3/uL (ref 145–400)
RBC: 4.06 10*6/uL (ref 3.70–5.45)
RDW: 13.1 % (ref 11.2–14.5)
WBC: 7.1 10*3/uL (ref 3.9–10.3)

## 2017-07-15 LAB — COMPREHENSIVE METABOLIC PANEL
ALT: 12 U/L (ref 0–55)
ANION GAP: 8 meq/L (ref 3–11)
AST: 16 U/L (ref 5–34)
Albumin: 4.1 g/dL (ref 3.5–5.0)
Alkaline Phosphatase: 79 U/L (ref 40–150)
BILIRUBIN TOTAL: 0.37 mg/dL (ref 0.20–1.20)
BUN: 13.2 mg/dL (ref 7.0–26.0)
CALCIUM: 9.7 mg/dL (ref 8.4–10.4)
CHLORIDE: 106 meq/L (ref 98–109)
CO2: 27 meq/L (ref 22–29)
CREATININE: 0.9 mg/dL (ref 0.6–1.1)
EGFR: 73 mL/min/{1.73_m2} — ABNORMAL LOW (ref >90)
Glucose: 88 mg/dl (ref 70–140)
Potassium: 3.8 mEq/L (ref 3.5–5.1)
Sodium: 140 mEq/L (ref 136–145)
TOTAL PROTEIN: 7.4 g/dL (ref 6.4–8.3)

## 2017-07-15 MED ORDER — EXEMESTANE 25 MG PO TABS: 25.0000 mg | ORAL_TABLET | Freq: Every day | ORAL | 1 refills | Status: DC

## 2017-07-15 MED ORDER — VENLAFAXINE HCL ER 150 MG PO CP24: 150.0000 mg | ORAL_CAPSULE | Freq: Every day | ORAL | 5 refills | Status: DC

## 2017-07-16 ENCOUNTER — Telehealth: Payer: Self-pay | Admitting: *Deleted

## 2017-07-16 ENCOUNTER — Other Ambulatory Visit: Payer: Self-pay | Admitting: *Deleted

## 2017-07-16 DIAGNOSIS — C50112 Malignant neoplasm of central portion of left female breast: Secondary | ICD-10-CM

## 2017-07-16 DIAGNOSIS — Z170 Estrogen receptor positive status [ER+]: Principal | ICD-10-CM

## 2017-07-16 MED ORDER — VENLAFAXINE HCL ER 150 MG PO CP24: 150.0000 mg | ORAL_CAPSULE | Freq: Every day | ORAL | 5 refills | Status: DC

## 2017-07-16 MED ORDER — EXEMESTANE 25 MG PO TABS: 25.0000 mg | ORAL_TABLET | Freq: Every day | ORAL | 1 refills | Status: DC

## 2017-07-16 NOTE — Telephone Encounter (Signed)
Called pt and left message on voice mail requesting a call back to nurse re:  Which pharmacy pt uses for nurse to call in Effexor and Exemestane refill for pt.

## 2017-07-16 NOTE — Telephone Encounter (Signed)
Pt called that the pharmacy to send rx to [facility] on [address]

## 2017-07-20 ENCOUNTER — Telehealth: Payer: Self-pay | Admitting: Hematology

## 2017-07-20 NOTE — Telephone Encounter (Signed)
Left voicemail for patient regarding appts that were scheduled per 9/13 sch msg.

## 2017-08-24 ENCOUNTER — Telehealth: Payer: Self-pay | Admitting: Hematology

## 2017-08-24 ENCOUNTER — Telehealth: Payer: Self-pay | Admitting: *Deleted

## 2017-08-24 NOTE — Telephone Encounter (Signed)
Patient left a voice mail message stating,"I would like to talk with Dr. Burr Medico to see if she would prescribe a low dose of Ritalin. I've been doing some research on the internet and it seems to help with focus. My return number is [phone]."

## 2017-08-24 NOTE — Telephone Encounter (Signed)
I called patient back regarding her request for prescription of Ritalin for her concentration issue. I recommend her to discuss this with her primary care physician, I do not think this is related to her breast cancer or exemestane. She is in the transition to a new primary care physician, and agrees to discuss with her new primary care physician.Her concentration issue has been going on for 2 years.  Truitt Merle  08/24/2017

## 2018-01-17 ENCOUNTER — Telehealth: Payer: Self-pay

## 2018-01-17 ENCOUNTER — Encounter: Payer: Self-pay | Admitting: Hematology

## 2018-01-17 ENCOUNTER — Inpatient Hospital Stay: Payer: BLUE CROSS/BLUE SHIELD | Attending: Hematology | Admitting: Hematology

## 2018-01-17 ENCOUNTER — Inpatient Hospital Stay: Payer: BLUE CROSS/BLUE SHIELD

## 2018-01-17 VITALS — BP 144/74 | HR 67 | Temp 98.2°F | Resp 18 | Ht 62.0 in | Wt 171.8 lb

## 2018-01-17 DIAGNOSIS — F17210 Nicotine dependence, cigarettes, uncomplicated: Secondary | ICD-10-CM | POA: Diagnosis not present

## 2018-01-17 DIAGNOSIS — R000 Tachycardia, unspecified: Secondary | ICD-10-CM | POA: Diagnosis not present

## 2018-01-17 DIAGNOSIS — C50112 Malignant neoplasm of central portion of left female breast: Secondary | ICD-10-CM | POA: Insufficient documentation

## 2018-01-17 DIAGNOSIS — E2839 Other primary ovarian failure: Secondary | ICD-10-CM | POA: Insufficient documentation

## 2018-01-17 DIAGNOSIS — Z170 Estrogen receptor positive status [ER+]: Secondary | ICD-10-CM | POA: Diagnosis not present

## 2018-01-17 LAB — COMPREHENSIVE METABOLIC PANEL
ALBUMIN: 4.1 g/dL (ref 3.5–5.0)
ALK PHOS: 72 U/L (ref 40–150)
ALT: 12 U/L (ref 0–55)
ANION GAP: 9 (ref 3–11)
AST: 14 U/L (ref 5–34)
BILIRUBIN TOTAL: 0.4 mg/dL (ref 0.2–1.2)
BUN: 10 mg/dL (ref 7–26)
CALCIUM: 9.6 mg/dL (ref 8.4–10.4)
CO2: 25 mmol/L (ref 22–29)
Chloride: 105 mmol/L (ref 98–109)
Creatinine, Ser: 0.9 mg/dL (ref 0.60–1.10)
GLUCOSE: 88 mg/dL (ref 70–140)
POTASSIUM: 4.2 mmol/L (ref 3.5–5.1)
Sodium: 139 mmol/L (ref 136–145)
TOTAL PROTEIN: 7.2 g/dL (ref 6.4–8.3)

## 2018-01-17 LAB — CBC WITH DIFFERENTIAL/PLATELET
BASOS ABS: 0 10*3/uL (ref 0.0–0.1)
Basophils Relative: 1 %
EOS PCT: 3 %
Eosinophils Absolute: 0.3 10*3/uL (ref 0.0–0.5)
HEMATOCRIT: 37.6 % (ref 34.8–46.6)
Hemoglobin: 12.3 g/dL (ref 11.6–15.9)
LYMPHS PCT: 28 %
Lymphs Abs: 2.3 10*3/uL (ref 0.9–3.3)
MCH: 31 pg (ref 25.1–34.0)
MCHC: 32.7 g/dL (ref 31.5–36.0)
MCV: 94.7 fL (ref 79.5–101.0)
MONO ABS: 0.8 10*3/uL (ref 0.1–0.9)
Monocytes Relative: 9 %
NEUTROS ABS: 4.8 10*3/uL (ref 1.5–6.5)
Neutrophils Relative %: 59 %
PLATELETS: 244 10*3/uL (ref 145–400)
RBC: 3.97 MIL/uL (ref 3.70–5.45)
RDW: 12.6 % (ref 11.2–14.5)
WBC: 8.2 10*3/uL (ref 3.9–10.3)

## 2018-01-17 MED ORDER — EXEMESTANE 25 MG PO TABS: 25.0000 mg | ORAL_TABLET | Freq: Every day | ORAL | 1 refills | Status: DC

## 2018-01-17 NOTE — Telephone Encounter (Signed)
Printed avs and calender of upcoming appointment. Per 3/18 los also called [facility] gave patient contact information.

## 2018-01-17 NOTE — Progress Notes (Signed)
[place]  [phone] [phone]  Clinic Follow-up Note   Patient Care Team: Antony Blackbird, MD (Inactive) as PCP - General (Family Medicine) 01/17/2018  CHIEF COMPLAINTS:  Left breast cancer  Oncology History   Cancer Staging Cancer of central portion of left female breast [facility]) Staging form: Breast, AJCC 8th Edition - Pathologic stage from 08/22/2014: Stage IA (pT1b, pN1b, cM0, G1, ER: Positive, PR: Positive, HER2: Negative) - Signed by Truitt Merle, MD on 01/12/2017 - Clinical: No stage assigned - Unsigned       Cancer of central portion of left female breast ([place])   06/11/2014 Mammogram    Bilateral diagnostic mammogram on 06/11/2014 showed a obscured spiculated 2.4 cm mass in the 3:00 position of the left breast. Ultrasound showed a 1.4 x 1.0 x 1.3 cm irregular hypoechoic mass with shadowing in the left breast at the 3:00 position 6 cm from the nipple. Sonographic evaluation the left axilla showed 2 lymph nodes with cortical thickening. The larger lymph node measured 0.8 cm.      06/13/2014 Initial Diagnosis    Cancer of central portion of left female breast ([place])      06/13/2014 Initial Biopsy    Left breast 3:00 biopsy showed invasive tubular carcinoma, grade 1, low-grade DCIS, left axillary lymph node biopsy negative for malignancy.      06/13/2014 Receptors her2    ER and 96% are strongly positive, PR 12% strong positive, HER-2 not amplified, Ki67 4%.      08/22/2014 Surgery    Left breast lumpectomy and sentinel lymph node biopsy       08/22/2014 Pathology Results    Left breast lumpectomy showed invasive tubular carcinoma, 0.8 cm, grade 1, margins were negative, DCIS, intermediate grade. 2 out of 7 sentinel lymph nodes had metastatic carcinoma, tumor deposit measuring 2-5 mm.        - 11/26/2014 Adjuvant Chemotherapy    On 11/26/2014, the patient completed her 4th cycle of Taxotere and Cytoxan chemotherapy.       12/28/2014 -  02/11/2015 Radiation Therapy    From 12/28/14 - 02/11/15, the patient completed radiation with 50.4 Gy in 28 fractions with a boost of 10 Gy in 5 fractions for a total dose of 60.4 Gy in 33 fractions to the right breast and axilla.      02/25/2015 - 09/18/2015 Anti-estrogen oral therapy    From 02/25/15 - 09/18/15, the patient was on AI therapy with Letrozole by Dr. Sheran Lawless. Discontinued due to intolerable joint pain.      09/18/2015 - 02/24/2016 Anti-estrogen oral therapy    On 09/18/15, the patient was switched to Exemestane due to intolerable joint pain on Letrozole.      02/24/2016 - 03/25/2016 Anti-estrogen oral therapy    Exemestane changed to Tamoxifen due to tolerance issues. Tamoxifen discontinued after 1 month due to continuing joint pain, fatigue, and mood changes.      03/25/2016 -  Anti-estrogen oral therapy    Tamoxifen discontinued due to continuing joint pain, fatigue, and mood changes. The patient was changed back to Exemestane.       HISTORY OF PRESENTING ILLNESS:  Jodi Hoover 57 y.o. female is here to establish care concerning her history of left breast cancer. She was treated at the [facility] at [facility], [place]), and wishes to transfer her oncology care to [place] due to her relocation. .  The patient had a palpable left breast mass by her OB-GYN in August 2015. Bilateral diagnostic  mammogram on 06/11/2014 showed a obscured spiculated 2.4 cm mass in the 3:00 position of the left breast. On physical exam, there was palpable discrete thickening in the left breast 3:00 position 6 cm from the nipple. Ultrasound at the time showed a 1.4 x 1.0 x 1.3 cm irregular hypoechoic mass with shadowing in the left breast at the 3:00 position 6 cm from the nipple. Sonographic evaluation the left axilla showed 2 lymph nodes with cortical thickening. The larger lymph node measured 0.8 cm. Biopsies were performed on 06/13/2014. Biopsy of the left breast mass revealed grade 1 IDC and  DCIS with necrosis and calcifications (ER96% +, PR 12% +, HER2 -, Ki67 4%). Biopsy of a left axillary lymph node was negative for carcinoma.  The patient underwent left breast lumpectomy and sentinel lymph node biopsy on 08/22/14. Lumpectomy revealed grade 1 IDC measuring 0.8 cm, the tumor was within 0.1 cm from the superficial anterior resection margin, intermedaite grade DCIS. Biopsy of 2/7 left axillary sentinel lymph nodes were positive. pT1b, pN1a  On 11/26/2014, the patient completed her 4th cycle of Taxotere and Cytoxan chemotherapy. From 12/28/14 - 02/11/15, the patient completed radiation with 50.4 Gy in 28 fractions with a boost of 10 Gy in 5 fractions for a total dose of 60.4 Gy in 33 fractions to the right breast and axilla.  From 02/25/15 - 09/18/15, the patient was on AI therapy with Letrozole by Dr. Sheran Lawless. On 09/18/15, the patient was switched to Exemestane due to intolerable joint pain on Letrozole. The patient was switched to Tamoxifen on 02/24/16 due to poor tolerance. The patient's pain continued and the patient experienced mood swings on Tamoxifen and she stopped Tamoxifen after being on it for a month. The patient was placed back on Exemestane due to the side effects being less severe. The patient reports her last mammogram was in April 2017.  The patient has a IUD implanted due to heavy bleeding in 2007 and did not have a menstural cycle since. The patient had her IUD removed in 2013 and she had hormone replacement. She no longer has hormone replacement after her cancer diagnosis.  The patient has fatigue, joint pain (should, hips, knees, back), and hot flashes. She wakes up stiff and it takes time for her to loosen up. The patient states she is pre-diabetic with her sugar hovering around 100. She also reports a heart murmur and HTN.  CURRENT THERAPY: Exemestane 25 mg 1 tablet daily since May 2017 (initially started in Letrozole in 02/2015)  Interim History: Jodi Hoover  returns for routine follow up. She presents to the clinic today by herself. She reports she is doing well overall. She is compliant with Exemestane and reports no complaints. She still has joint and muscle pain. She uses Aleve mostly and tramadol if her pain is severe. She is trying to be more active and she believes that is helping.   On review of systems, pt denies any new pain, leg swelling, or any other complaints at this time. Pertinent positives are listed and detailed within the above HPI.   MEDICAL HISTORY:  Past Medical History:  Diagnosis Date  . Atrial tachycardia, paroxysmal ([place])   . Depression   . Heart murmur   . Hypertension   . PVC's (premature ventricular contractions)     SURGICAL HISTORY: Past Surgical History:  Procedure Laterality Date  . left breast lumpectomy  08/2014    SOCIAL HISTORY: Social History   Socioeconomic History  . Marital status: Married  Spouse name: Not on file  . Number of children: Not on file  . Years of education: Not on file  . Highest education level: Not on file  Social Needs  . Financial resource strain: Not on file  . Food insecurity - worry: Not on file  . Food insecurity - inability: Not on file  . Transportation needs - medical: Not on file  . Transportation needs - non-medical: Not on file  Occupational History  . Not on file  Tobacco Use  . Smoking status: Current Every Day Smoker    Packs/day: 1.00    Years: 16.00    Pack years: 16.00  . Smokeless tobacco: Never Used  Substance and Sexual Activity  . Alcohol use: Yes    Comment: ocassionally wine  . Drug use: No  . Sexual activity: Yes    Birth control/protection: IUD  Other Topics Concern  . Not on file  Social History Narrative  . Not on file    FAMILY HISTORY: Family History  Problem Relation Age of Onset  . Hypertension Mother   . Cancer Paternal Aunt        lung cancer    ALLERGIES:  is allergic to other.  MEDICATIONS:  Current Outpatient  Medications  Medication Sig Dispense Refill  . BYSTOLIC 2.5 MG tablet TAKE 1 TABLET DAILY 90 tablet 0  . Calcium-Magnesium-Vitamin D (CALCIUM 500 PO) Take 1 tablet by mouth daily.    Marland Kitchen exemestane (AROMASIN) 25 MG tablet Take 1 tablet (25 mg total) by mouth daily after breakfast. 90 tablet 1  . gabapentin (NEURONTIN) 600 MG tablet Take 1 tablet (600 mg total) by mouth 3 (three) times daily. 90 tablet 0  . Ginseng 50 MG CAPS Take 1 capsule by mouth daily.    . Liraglutide -Weight Management (SAXENDA) 18 MG/3ML SOPN Inject 3 mLs into the skin daily.    Marland Kitchen lisinopril (PRINIVIL,ZESTRIL) 5 MG tablet Take 5 mg by mouth daily.    . metFORMIN (GLUCOPHAGE) 500 MG tablet Take 500 mg by mouth 2 (two) times daily with a meal.    . methylphenidate (RITALIN) 20 MG tablet Take 1 tablet by mouth daily.    . Methylsulfonylmethane (MSM) 1000 MG CAPS Take 2,000 mg by mouth daily.    . traMADol (ULTRAM) 50 MG tablet Take 1 tablet (50 mg total) by mouth as needed. 30 tablet 1  . venlafaxine XR (EFFEXOR XR) 150 MG 24 hr capsule Take 1 capsule (150 mg total) by mouth daily with breakfast. 30 capsule 5  . APPLE CIDER VINEGAR PO Take 1 tablet by mouth daily.     No current facility-administered medications for this visit.     REVIEW OF SYSTEMS:   Constitutional: Denies fevers, chills (+) Hot flashes (+) fatigue Eyes: Denies blurriness of vision, double vision or watery eyes Ears, nose, mouth, throat, and face: Denies mucositis or sore throat Respiratory: Denies cough, dyspnea or wheezes Cardiovascular: Denies palpitation, chest discomfort or lower extremity swelling Gastrointestinal:  Denies nausea, heartburn or change in bowel habits Skin: Denies abnormal skin rashes Lymphatics: Denies new lymphadenopathy or easy bruising Neurological:Denies numbness, tingling or new weaknesses MSK (+) Joint pain in her knees, back, hips, bottoms of feet, and shoulders. Described as 6-7 out of 10 on pain scale. Behavioral/Psych:  Mood is stable, no new changes  All other systems were reviewed with the patient and are negative.  PHYSICAL EXAMINATION: ECOG PERFORMANCE STATUS: 0 - Asymptomatic  Vitals:   01/17/18 1505  BP: Marland Kitchen)  144/74  Pulse: 67  Resp: 18  Temp: 98.2 F (36.8 C)  SpO2: 98%   Filed Weights   01/17/18 1505  Weight: 171 lb 12.8 oz (77.9 kg)    GENERAL:alert, no distress and comfortable SKIN: skin color, texture, turgor are normal, no rashes or significant lesions EYES: normal, conjunctiva are pink and non-injected, sclera clear OROPHARYNX:no exudate, no erythema and lips, buccal mucosa, and tongue normal  NECK: supple, thyroid normal size, non-tender, without nodularity LYMPH:  no palpable lymphadenopathy in the cervical chains LUNGS: clear to auscultation and percussion with normal breathing effort HEART: regular rate & rhythm and no murmurs and no lower extremity edema ABDOMEN:abdomen soft, non-tender and normal bowel sounds Musculoskeletal:no cyanosis of digits and no clubbing  PSYCH: alert & oriented x 3 with fluent speech NEURO: no focal motor/sensory deficits BREAST:Surgical scar in left axilla and left breast. She has mild scar tissue around the left axilla incision, no palpable mass or adenopathy. Right breast with small bumps around the areola, likely benign (they have been there her whole life) but other wise negative   LABORATORY DATA:  I have reviewed the data as listed CBC Latest Ref Rng & Units 01/17/2018 07/15/2017 01/12/2017  WBC 3.9 - 10.3 K/uL 8.2 7.1 6.9  Hemoglobin 11.6 - 15.9 g/dL 12.3 12.8 13.1  Hematocrit 34.8 - 46.6 % 37.6 37.7 39.2  Platelets 145 - 400 K/uL 244 255 252   CMP Latest Ref Rng & Units 01/17/2018 07/15/2017 01/12/2017  Glucose 70 - 140 mg/dL 88 88 88  BUN 7 - 26 mg/dL 10 13.2 12.4  Creatinine 0.60 - 1.10 mg/dL 0.90 0.9 0.8  Sodium 136 - 145 mmol/L 139 140 142  Potassium 3.5 - 5.1 mmol/L 4.2 3.8 4.6  Chloride 98 - 109 mmol/L 105 - -  CO2 22 - 29 mmol/L  25 27 26   Calcium 8.4 - 10.4 mg/dL 9.6 9.7 9.8  Total Protein 6.4 - 8.3 g/dL 7.2 7.4 7.5  Total Bilirubin 0.2 - 1.2 mg/dL 0.4 0.37 0.28  Alkaline Phos 40 - 150 U/L 72 79 79  AST 5 - 34 U/L 14 16 19   ALT 0 - 55 U/L 12 12 18      RADIOGRAPHIC STUDIES: I have personally reviewed the radiological images as listed and agreed with the findings in the report. No results found.   [place] Left axilla 02/02/17 IMPRESSION: No mammographic or ultrasound evidence for malignancy. RECOMMENDATION: Diagnostic mammogram is suggested in 1 year. (Code:DM-B-01Y)  Diagnostic bilateral mammogram 02/02/17 IMPRESSION: No mammographic or ultrasound evidence for malignancy. RECOMMENDATION: Diagnostic mammogram is suggested in 1 year. (Code:DM-B-01Y)  ASSESSMENT & PLAN: 57 y.o. post-menopausal Caucasian female with a palpable detected left breast cancer  1. 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  -The patient has completed surgery (left lumpectomy and SLN biopsy), adjuvant chemotherapy, and adjuvant radiation as part of her cancer treatment in 2016. -We previously discussed her risk of recurrence. She had Oncotype test done in 2016, recurrence score was 26, intermediate risk. -The patient has been on anti-estrogen medication from 02/25/15 consisting of Letrozole -> Exemestane -> Tamoxifen -> Exemestane.  -The patient has been on multiple medications due to poor tolerance. She is currently on Exemestane 25 mg 1 tablet daily. The patient has moderate hot flashes and joint pain on this medication, overall manageable  and she agrees to continue.  We will plan for a total of adjuvant antiestrogen therapy for 7 years. --She is clinically doing well, she does self-exam regularly, exam was unremarkable. Her last  mammogram was in April 2018 with left axilla US showed no mammographic or ultrasound evidence for malignancy. No clinical concern for recurrence.  - She is due next month for mammogram.  -Labs reviewed, CBC and CMP are WNL --F/u and labs in 6 months   2. Smoking -I previously consulted the patient about smoking cessation. -She is interested in quitting, she has tried various methods to quit, was not successful.  3. Hyperglycemia, HTN -Continue close monitoring at home. -Advised the patient to eat well and nutritious food. -To be managed by PCP. -She states her sugars have been stable lately   4. Arthralgia and hot flush -She continues to have moderate to severe hot flashes and joint pain with exemestane. She is currently taking Effexor 75 mg daily and Tramadol 50 mg as needed.  -I will increase Effexor dose to 150 mg daily from 75 mg daily as of 07/2017  -Her hot flash is manageable on higher dose of Effexor   5. Bone health  -Pt will try to find her last DEXA scan  -We previously discussed the potential side effects of osteoporosis from exemestane -We'll monitor her bone density scan every 2 years -I encouraged her to take calcium and vitamin D - I will order DEXA for her to have done in July or august 2019   PLAN -Continue exemestane (refilled today) and Effexor  -Mammogram in 2-3 weeks  -DEXA ordered for July/August 2019 -Lab and f/u in 6 months   Orders Placed This Encounter  Procedures  . MM DIAG BREAST TOMO BILATERAL    INS-BCBS PF 02/02/17 @ [facility]/NO PROBLEMS/HX LT LUMPECTOMY/NO NEEDS/BC PT COSIGN REQ    Standing Status:   Future    Standing Expiration Date:   01/18/2019    Order Specific Question:   Reason for Exam (SYMPTOM  OR DIAGNOSIS REQUIRED)    Answer:   screening    Order Specific Question:   Is the patient pregnant?    Answer:   No    Order Specific Question:   Preferred imaging location?    Answer:   [facility]  . DG Bone Density    Standing Status:    Future    Standing Expiration Date:   01/17/2019    Order Specific Question:   Reason for Exam (SYMPTOM  OR DIAGNOSIS REQUIRED)    Answer:   screening    Order Specific Question:   Is the patient pregnant?    Answer:   No    Order Specific Question:   Preferred imaging location?    Answer:   [facility]    All questions were answered. The patient knows to call the clinic with any problems, questions or concerns. I spent 20 minutes counseling the patient face to face. The total time spent in the appointment was 25 minutes and more than 50% was on counseling.  This document serves as a record of services personally performed by Truitt Merle, MD. It was created on her behalf by Theresia Bough, a trained medical scribe. The creation of this record is based on the scribe's personal observations and the provider's statements to them.   I have reviewed the above documentation for accuracy and completeness, and I agree with the above.     Truitt Merle, MD 01/17/2018 4:16 PM

## 2018-02-10 ENCOUNTER — Ambulatory Visit
Admission: RE | Admit: 2018-02-10 | Discharge: 2018-02-10 | Disposition: A | Discharge status: Home / Self Care | Payer: BLUE CROSS/BLUE SHIELD | Source: Ambulatory Visit | Attending: Hematology | Admitting: Hematology

## 2018-02-10 DIAGNOSIS — Z170 Estrogen receptor positive status [ER+]: Principal | ICD-10-CM

## 2018-02-10 DIAGNOSIS — C50112 Malignant neoplasm of central portion of left female breast: Secondary | ICD-10-CM

## 2018-02-10 HISTORY — DX: Personal history of irradiation: Z92.3

## 2018-02-10 HISTORY — DX: Personal history of antineoplastic chemotherapy: Z92.21

## 2018-04-07 ENCOUNTER — Other Ambulatory Visit: Payer: Self-pay | Admitting: Hematology

## 2018-04-07 DIAGNOSIS — C50112 Malignant neoplasm of central portion of left female breast: Secondary | ICD-10-CM

## 2018-04-07 DIAGNOSIS — Z170 Estrogen receptor positive status [ER+]: Principal | ICD-10-CM

## 2018-07-13 ENCOUNTER — Telehealth: Payer: Self-pay | Admitting: Hematology

## 2018-07-13 NOTE — Telephone Encounter (Signed)
YF PAL 9/18 - moved 9/18 appointments to 10/11. Left message. Schedule mailed.

## 2018-07-20 ENCOUNTER — Other Ambulatory Visit: Payer: BLUE CROSS/BLUE SHIELD

## 2018-07-20 ENCOUNTER — Ambulatory Visit: Payer: BLUE CROSS/BLUE SHIELD | Admitting: Hematology

## 2018-08-12 ENCOUNTER — Inpatient Hospital Stay: Payer: BLUE CROSS/BLUE SHIELD | Attending: Family Medicine

## 2018-08-12 ENCOUNTER — Inpatient Hospital Stay: Payer: BLUE CROSS/BLUE SHIELD | Admitting: Hematology

## 2018-08-19 ENCOUNTER — Other Ambulatory Visit: Payer: Self-pay | Admitting: Hematology

## 2018-08-19 DIAGNOSIS — C50112 Malignant neoplasm of central portion of left female breast: Secondary | ICD-10-CM

## 2018-08-19 DIAGNOSIS — Z170 Estrogen receptor positive status [ER+]: Principal | ICD-10-CM

## 2018-08-31 ENCOUNTER — Telehealth: Payer: Self-pay | Admitting: Hematology

## 2018-08-31 NOTE — Telephone Encounter (Signed)
Tried to reach regarding voicemail °

## 2018-09-05 NOTE — Progress Notes (Signed)
[place]  [phone] [phone]  Clinic Follow-up Note   Patient Care Team: [name], FNP as PCP - General (Family Medicine) 09/07/2018  CHIEF COMPLAINTS:  Left breast cancer  Oncology History   Cancer Staging Cancer of central portion of left female breast [facility]) Staging form: Breast, AJCC 8th Edition - Pathologic stage from 08/22/2014: Stage IA (pT1b, pN1b, cM0, G1, ER: Positive, PR: Positive, HER2: Negative) - Signed by Truitt Merle, MD on 01/12/2017 - Clinical: No stage assigned - Unsigned       Cancer of central portion of left female breast ([place])   06/11/2014 Mammogram    Bilateral diagnostic mammogram on 06/11/2014 showed a obscured spiculated 2.4 cm mass in the 3:00 position of the left breast. Ultrasound showed a 1.4 x 1.0 x 1.3 cm irregular hypoechoic mass with shadowing in the left breast at the 3:00 position 6 cm from the nipple. Sonographic evaluation the left axilla showed 2 lymph nodes with cortical thickening. The larger lymph node measured 0.8 cm.    06/13/2014 Initial Diagnosis    Cancer of central portion of left female breast ([place])    06/13/2014 Initial Biopsy    Left breast 3:00 biopsy showed invasive tubular carcinoma, grade 1, low-grade DCIS, left axillary lymph node biopsy negative for malignancy.    06/13/2014 Receptors her2    ER and 96% are strongly positive, PR 12% strong positive, HER-2 not amplified, Ki67 4%.    08/22/2014 Surgery    Left breast lumpectomy and sentinel lymph node biopsy     08/22/2014 Pathology Results    Left breast lumpectomy showed invasive tubular carcinoma, 0.8 cm, grade 1, margins were negative, DCIS, intermediate grade. 2 out of 7 sentinel lymph nodes had metastatic carcinoma, tumor deposit measuring 2-5 mm.      - 11/26/2014 Adjuvant Chemotherapy    On 11/26/2014, the patient completed her 4th cycle of Taxotere and Cytoxan chemotherapy.     12/28/2014 - 02/11/2015 Radiation Therapy   From 12/28/14 - 02/11/15, the patient completed radiation with 50.4 Gy in 28 fractions with a boost of 10 Gy in 5 fractions for a total dose of 60.4 Gy in 33 fractions to the right breast and axilla.    02/25/2015 - 09/18/2015 Anti-estrogen oral therapy    From 02/25/15 - 09/18/15, the patient was on AI therapy with Letrozole by Dr. Sheran Lawless. Discontinued due to intolerable joint pain.    09/18/2015 - 02/24/2016 Anti-estrogen oral therapy    On 09/18/15, the patient was switched to Exemestane due to intolerable joint pain on Letrozole.    02/24/2016 - 03/25/2016 Anti-estrogen oral therapy    Exemestane changed to Tamoxifen due to tolerance issues. Tamoxifen discontinued after 1 month due to continuing joint pain, fatigue, and mood changes.    03/25/2016 -  Anti-estrogen oral therapy    Tamoxifen discontinued due to continuing joint pain, fatigue, and mood changes. The patient was changed back to Exemestane.    02/10/2018 Mammogram    02/10/2018 Mammogram IMPRESSION: No mammographic evidence of malignancy in either breast, status post left lumpectomy.     HISTORY OF PRESENTING ILLNESS:  Jodi Hoover 57 y.o. female is here to establish care concerning her history of left breast cancer. She was treated at the [facility], [place]), and wishes to transfer her oncology care to [place] due to her relocation. .  The patient had a palpable left breast mass by her OB-GYN in August 2015. Bilateral diagnostic mammogram on 06/11/2014 showed  a obscured spiculated 2.4 cm mass in the 3:00 position of the left breast. On physical exam, there was palpable discrete thickening in the left breast 3:00 position 6 cm from the nipple. Ultrasound at the time showed a 1.4 x 1.0 x 1.3 cm irregular hypoechoic mass with shadowing in the left breast at the 3:00 position 6 cm from the nipple. Sonographic evaluation the left axilla showed 2 lymph nodes with cortical thickening. The larger lymph node measured  0.8 cm. Biopsies were performed on 06/13/2014. Biopsy of the left breast mass revealed grade 1 IDC and DCIS with necrosis and calcifications (ER96% +, PR 12% +, HER2 -, Ki67 4%). Biopsy of a left axillary lymph node was negative for carcinoma.  The patient underwent left breast lumpectomy and sentinel lymph node biopsy on 08/22/14. Lumpectomy revealed grade 1 IDC measuring 0.8 cm, the tumor was within 0.1 cm from the superficial anterior resection margin, intermedaite grade DCIS. Biopsy of 2/7 left axillary sentinel lymph nodes were positive. pT1b, pN1a  On 11/26/2014, the patient completed her 4th cycle of Taxotere and Cytoxan chemotherapy. From 12/28/14 - 02/11/15, the patient completed radiation with 50.4 Gy in 28 fractions with a boost of 10 Gy in 5 fractions for a total dose of 60.4 Gy in 33 fractions to the right breast and axilla.  From 02/25/15 - 09/18/15, the patient was on AI therapy with Letrozole by Dr. Sheran Lawless. On 09/18/15, the patient was switched to Exemestane due to intolerable joint pain on Letrozole. The patient was switched to Tamoxifen on 02/24/16 due to poor tolerance. The patient's pain continued and the patient experienced mood swings on Tamoxifen and she stopped Tamoxifen after being on it for a month. The patient was placed back on Exemestane due to the side effects being less severe. The patient reports her last mammogram was in April 2017.  The patient has a IUD implanted due to heavy bleeding in 2007 and did not have a menstural cycle since. The patient had her IUD removed in 2013 and she had hormone replacement. She no longer has hormone replacement after her cancer diagnosis.  The patient has fatigue, joint pain (should, hips, knees, back), and hot flashes. She wakes up stiff and it takes time for her to loosen up. The patient states she is pre-diabetic with her sugar hovering around 100. She also reports a heart murmur and HTN.  CURRENT THERAPY: Exemestane 25 mg 1 tablet  daily since May 2017 (initially started Letrozole in 02/2015)  Interim History: Jodi Hoover returns for routine follow up. She was last seen by me 6 months ago. Her 2019 mammogram was benign. Today, she is here alone. She is doing well and has no new complaints. She is tolerating exemestane well with no noticeable side effects. She gets muscular and bone pain. She noticed memory and concentration problems. She noticed short term memory loss after chemo. 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 She denies hot flashes. She is trying to lose weight, and is working with her GP.  LMP 2015    MEDICAL HISTORY:  Past Medical History:  Diagnosis Date  . Atrial tachycardia, paroxysmal ([place])   . Depression   . Heart murmur   . Hypertension   . Personal history of chemotherapy   . Personal history of radiation therapy   . PVC's (premature ventricular contractions)     SURGICAL HISTORY: Past Surgical History:  Procedure Laterality Date  . BREAST EXCISIONAL BIOPSY  Right   . BREAST LUMPECTOMY Left 08/2014  . left breast lumpectomy  08/2014    SOCIAL HISTORY: Social History   Socioeconomic History  . Marital status: Married    Spouse name: Not on file  . Number of children: Not on file  . Years of education: Not on file  . Highest education level: Not on file  Occupational History  . Not on file  Social Needs  . Financial resource strain: Not on file  . Food insecurity:    Worry: Not on file    Inability: Not on file  . Transportation needs:    Medical: Not on file    Non-medical: Not on file  Tobacco Use  . Smoking status: Current Every Day Smoker    Packs/day: 1.00    Years: 16.00    Pack years: 16.00  . Smokeless tobacco: Never Used  Substance and Sexual Activity  . Alcohol use: Yes    Comment: ocassionally wine  . Drug use: No  . Sexual activity: Yes    Birth control/protection: IUD  Lifestyle  . Physical activity:    Days per  week: Not on file    Minutes per session: Not on file  . Stress: Not on file  Relationships  . Social connections:    Talks on phone: Not on file    Gets together: Not on file    Attends religious service: Not on file    Active member of club or organization: Not on file    Attends meetings of clubs or organizations: Not on file    Relationship status: Not on file  . Intimate partner violence:    Fear of current or ex partner: Not on file    Emotionally abused: Not on file    Physically abused: Not on file    Forced sexual activity: Not on file  Other Topics Concern  . Not on file  Social History Narrative  . Not on file    FAMILY HISTORY: Family History  Problem Relation Age of Onset  . Hypertension Mother   . Cancer Paternal Aunt        lung cancer    ALLERGIES:  is allergic to other.  MEDICATIONS:  Current Outpatient Medications  Medication Sig Dispense Refill  . Biotin 10000 MCG TABS Take by mouth.    . Calcium-Magnesium-Vitamin D (CALCIUM 500 PO) Take 1 tablet by mouth daily.    . COLLAGEN PO Take 1,000 mg by mouth.    Marland Kitchen exemestane (AROMASIN) 25 MG tablet TAKE ONE TABLET BY MOUTH ONE TIME DAILY AFTER BREAKFAST 30 tablet 5  . ferrous sulfate 325 (65 FE) MG EC tablet Take 65 mg by mouth 3 (three) times daily with meals.    . gabapentin (NEURONTIN) 600 MG tablet Take 1 tablet (600 mg total) by mouth 3 (three) times daily. 90 tablet 3  . lisinopril (PRINIVIL,ZESTRIL) 5 MG tablet Take 5 mg by mouth daily.    . methylphenidate (RITALIN) 20 MG tablet Take 1 tablet by mouth daily.    Marland Kitchen topiramate (TOPAMAX) 50 MG tablet Take 50 mg by mouth 2 (two) times daily.    Marland Kitchen venlafaxine XR (EFFEXOR-XR) 150 MG 24 hr capsule TAKE ONE CAPSULE (150MG TOTAL) BY MOUTH DAILY WITH BREAKFAST 30 capsule 5  . APPLE CIDER VINEGAR PO Take 1 tablet by mouth daily.    Marland Kitchen BYSTOLIC 2.5 MG tablet TAKE 1 TABLET DAILY (Patient not taking: Reported on 09/07/2018) 90 tablet 0  . Ginseng 50  MG CAPS Take 1  capsule by mouth daily.    . Liraglutide -Weight Management (SAXENDA) 18 MG/3ML SOPN Inject 3 mLs into the skin daily.    . metFORMIN (GLUCOPHAGE) 500 MG tablet Take 500 mg by mouth 2 (two) times daily with a meal.    . Methylsulfonylmethane (MSM) 1000 MG CAPS Take 2,000 mg by mouth daily.    . traMADol (ULTRAM) 50 MG tablet Take 1 tablet (50 mg total) by mouth as needed. 30 tablet 1   No current facility-administered medications for this visit.     REVIEW OF SYSTEMS:   Constitutional: Denies fevers, chills, or night sweats Eyes: Denies blurriness of vision, double vision or watery eyes Ears, nose, mouth, throat, and face: Denies mucositis or sore throat Respiratory: Denies cough, dyspnea or wheezes Cardiovascular: Denies palpitation, chest discomfort or lower extremity swelling Gastrointestinal:  Denies nausea, heartburn or change in bowel habits Skin: Denies abnormal skin rashes Lymphatics: Denies new lymphadenopathy or easy bruising Neurological:Denies numbness, tingling or new weaknesses (+) short term memory loss MSK (+) bone and muscle pain  Behavioral/Psych: Mood is stable, no new changes  All other systems were reviewed with the patient and are negative.  PHYSICAL EXAMINATION: ECOG PERFORMANCE STATUS: 0 - Asymptomatic  Vitals:   09/07/18 1404  BP: 138/72  Pulse: 63  Resp: 16  Temp: 98.7 F (37.1 C)  SpO2: 96%   Filed Weights   09/07/18 1404  Weight: 163 lb 12.8 oz (74.3 kg)    GENERAL:alert, no distress and comfortable SKIN: skin color, texture, turgor are normal, no rashes or significant lesions EYES: normal, conjunctiva are pink and non-injected, sclera clear OROPHARYNX:no exudate, no erythema and lips, buccal mucosa, and tongue normal  NECK: supple, thyroid normal size, non-tender, without nodularity LYMPH:  no palpable lymphadenopathy in the cervical chains LUNGS: clear to auscultation and percussion with normal breathing effort HEART: regular rate & rhythm  and no murmurs and no lower extremity edema ABDOMEN:abdomen soft, non-tender and normal bowel sounds Musculoskeletal:no cyanosis of digits and no clubbing  PSYCH: alert & oriented x 3 with fluent speech NEURO: no focal motor/sensory deficits BREAST:Surgical scar in left axilla and left breast. She has mild scar tissue around the left axilla incision, no palpable mass or adenopathy. Right breast with small bumps around the areola, likely benign (they have been there her whole life) but other wise negative.   LABORATORY DATA:  I have reviewed the data as listed CBC Latest Ref Rng & Units 09/07/2018 01/17/2018 07/15/2017  WBC 4.0 - 10.5 K/uL 7.8 8.2 7.1  Hemoglobin 12.0 - 15.0 g/dL 11.5(L) 12.3 12.8  Hematocrit 36.0 - 46.0 % 35.9(L) 37.6 37.7  Platelets 150 - 400 K/uL 245 244 255   CMP Latest Ref Rng & Units 09/07/2018 01/17/2018 07/15/2017  Glucose 70 - 99 mg/dL 91 88 88  BUN 6 - 20 mg/dL 9 10 13.2  Creatinine 0.44 - 1.00 mg/dL 0.84 0.90 0.9  Sodium 135 - 145 mmol/L 143 139 140  Potassium 3.5 - 5.1 mmol/L 3.9 4.2 3.8  Chloride 98 - 111 mmol/L 110 105 -  CO2 22 - 32 mmol/L _0 Calcium 8.9 - 10.3 mg/dL 9.3 9.6 9.7  Total Protein 6.5 - 8.1 g/dL 6.9 7.2 7.4  Total Bilirubin 0.3 - 1.2 mg/dL 0.3 0.4 0.37  Alkaline Phos 38 - 126 U/L 66 72 79  AST 15 - 41 U/L 14(L) 14 16  ALT 0 - 44 U/L 13 12 12  RADIOGRAPHIC STUDIES: I have personally reviewed the radiological images as listed and agreed with the findings in the report. No results found.   02/10/2018 Mammogram IMPRESSION: No mammographic evidence of malignancy in either breast, status post left lumpectomy.  [place] Left axilla 02/02/17 IMPRESSION: No mammographic or ultrasound evidence for malignancy. RECOMMENDATION: Diagnostic mammogram is suggested in 1 year. (Code:DM-B-01Y)  Diagnostic bilateral mammogram 02/02/17 IMPRESSION: No mammographic or ultrasound evidence for malignancy. RECOMMENDATION: Diagnostic mammogram is suggested  in 1 year. (Code:DM-B-01Y)  ASSESSMENT & PLAN:  57 y.o. post-menopausal Caucasian female with a palpable detected left breast cancer  1. 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  -The patient has completed surgery (left lumpectomy and SLN biopsy), adjuvant chemotherapy, and adjuvant radiation as part of her cancer treatment in 2016. -We previously discussed her risk of recurrence. She had Oncotype test done in 2016, recurrence score was 26, intermediate risk. -The patient has been on anti-estrogen medication from 02/25/15 consisting of Letrozole -> Exemestane -> Tamoxifen -> Exemestane.  -The patient has been on multiple medications due to poor tolerance. She is currently on Exemestane 25 mg 1 tablet daily. The patient has moderate hot flashes and joint pain on this medication, overall manageable and she agrees to continue.  We will plan for a total of adjuvant antiestrogen therapy for 7 years. --She is clinically doing well, no concerns for recurrence - Mammogram on 02/10/2018 was benign. I reviewed with the patient.  -Labs reviewed, CBC showed Hg 11.5. CMP pending. She is 65 mg iron pills daily. I advised her to take multivitamins.  -I educated her about symptoms of cancer recurrence including weight loss, new lumps or pains.  -F/u and labs in 6 months   2. Smoking -I previously consulted the patient about smoking cessation. -She is interested in quitting, she has tried various methods to quit, was not successful.  3. Hyperglycemia, HTN -Continue close monitoring at home. -Advised the patient to eat well and nutritious food. -To be managed by PCP. -She states her sugars have been stable lately   4. Arthralgia and hot flush -She continues to have moderate to severe hot flashes and  joint pain with exemestane. She is currently taking Effexor 75 mg daily and Tramadol 50 mg as needed.  -I will increase Effexor dose to 150 mg daily from 75 mg daily as of 07/2017  -Her hot flash is manageable on higher dose of Effexor   5. Bone health  -Pt will try to find her last DEXA scan  -We previously discussed the potential side effects of osteoporosis from exemestane -We'll monitor her bone density scan every 2 years -I encouraged her to take calcium and vitamin D - I ordered DEXA for her on last visit but not scheduled, will request again   6. New onset mild anemia -CBC showed Hg 11.5 -I advised her to take multivitamins and f/u with labs with PCP  -she will repeat CBC with her PCP in 2 months   7. Memory loss -She complains of significant memory loss, especially short-term memory, since her chemotherapy -We will screen her for our clinical trial "REMEMBER", she met our research nurse after her visit with me today.  PLAN -Continue exemestane -I refilled exemestane, Effexor, and gabapentin today   -Lab and f/u in 6 months  -mammogram and DEXA in  02/2019   Orders Placed This Encounter  Procedures  . MM DIAG BREAST TOMO BILATERAL    Standing Status:   Future    Standing Expiration Date:   09/08/2019    Order Specific Question:   Reason for Exam (SYMPTOM  OR DIAGNOSIS REQUIRED)    Answer:   screening    Order Specific Question:   Is the patient pregnant?    Answer:   No    Order Specific Question:   Preferred imaging location?    Answer:   [facility]  . DG Bone Density    Standing Status:   Future    Standing Expiration Date:   09/07/2019    Order Specific Question:   Reason for Exam (SYMPTOM  OR DIAGNOSIS REQUIRED)    Answer:   screening    Order Specific Question:   Is the patient pregnant?    Answer:   No    Order Specific Question:   Preferred imaging location?    Answer:   [facility]  . CA 27.29    Standing Status:   Standing    Number of  Occurrences:   30    Standing Expiration Date:   09/08/2023    All questions were answered. The patient knows to call the clinic with any problems, questions or concerns. I spent 20 minutes counseling the patient face to face. The total time spent in the appointment was 25 minutes and more than 50% was on counseling.  Dierdre Searles Dweik am acting as scribe for Dr. Truitt Merle.  I have reviewed the above documentation for accuracy and completeness, and I agree with the above.      Truitt Merle, MD 09/07/2018

## 2018-09-07 ENCOUNTER — Telehealth: Payer: Self-pay

## 2018-09-07 ENCOUNTER — Inpatient Hospital Stay (HOSPITAL_BASED_OUTPATIENT_CLINIC_OR_DEPARTMENT_OTHER): Payer: BLUE CROSS/BLUE SHIELD | Admitting: Hematology

## 2018-09-07 ENCOUNTER — Encounter: Payer: Self-pay | Admitting: Hematology

## 2018-09-07 ENCOUNTER — Encounter: Payer: Self-pay | Admitting: *Deleted

## 2018-09-07 ENCOUNTER — Inpatient Hospital Stay: Payer: BLUE CROSS/BLUE SHIELD | Attending: Family Medicine

## 2018-09-07 VITALS — BP 138/72 | HR 63 | Temp 98.7°F | Resp 16 | Ht 62.0 in | Wt 163.8 lb

## 2018-09-07 DIAGNOSIS — D649 Anemia, unspecified: Secondary | ICD-10-CM | POA: Insufficient documentation

## 2018-09-07 DIAGNOSIS — Z79899 Other long term (current) drug therapy: Secondary | ICD-10-CM | POA: Diagnosis not present

## 2018-09-07 DIAGNOSIS — E2839 Other primary ovarian failure: Secondary | ICD-10-CM

## 2018-09-07 DIAGNOSIS — I10 Essential (primary) hypertension: Secondary | ICD-10-CM | POA: Insufficient documentation

## 2018-09-07 DIAGNOSIS — F17210 Nicotine dependence, cigarettes, uncomplicated: Secondary | ICD-10-CM | POA: Insufficient documentation

## 2018-09-07 DIAGNOSIS — Z923 Personal history of irradiation: Secondary | ICD-10-CM | POA: Insufficient documentation

## 2018-09-07 DIAGNOSIS — C50112 Malignant neoplasm of central portion of left female breast: Secondary | ICD-10-CM | POA: Insufficient documentation

## 2018-09-07 DIAGNOSIS — Z79811 Long term (current) use of aromatase inhibitors: Secondary | ICD-10-CM | POA: Insufficient documentation

## 2018-09-07 DIAGNOSIS — Z170 Estrogen receptor positive status [ER+]: Secondary | ICD-10-CM | POA: Diagnosis not present

## 2018-09-07 DIAGNOSIS — Z9221 Personal history of antineoplastic chemotherapy: Secondary | ICD-10-CM

## 2018-09-07 LAB — COMPREHENSIVE METABOLIC PANEL
ALK PHOS: 66 U/L (ref 38–126)
ALT: 13 U/L (ref 0–44)
AST: 14 U/L — AB (ref 15–41)
Albumin: 3.9 g/dL (ref 3.5–5.0)
Anion gap: 9 (ref 5–15)
BILIRUBIN TOTAL: 0.3 mg/dL (ref 0.3–1.2)
BUN: 9 mg/dL (ref 6–20)
CALCIUM: 9.3 mg/dL (ref 8.9–10.3)
CO2: 24 mmol/L (ref 22–32)
CREATININE: 0.84 mg/dL (ref 0.44–1.00)
Chloride: 110 mmol/L (ref 98–111)
GFR calc Af Amer: >60 mL/min (ref >60)
Glucose, Bld: 91 mg/dL (ref 70–99)
Potassium: 3.9 mmol/L (ref 3.5–5.1)
Sodium: 143 mmol/L (ref 135–145)
TOTAL PROTEIN: 6.9 g/dL (ref 6.5–8.1)

## 2018-09-07 LAB — CBC WITH DIFFERENTIAL/PLATELET
Abs Immature Granulocytes: 0.02 10*3/uL (ref 0.00–0.07)
Basophils Absolute: 0.1 10*3/uL (ref 0.0–0.1)
Basophils Relative: 1 %
EOS ABS: 0.2 10*3/uL (ref 0.0–0.5)
EOS PCT: 3 %
HCT: 35.9 % — ABNORMAL LOW (ref 36.0–46.0)
Hemoglobin: 11.5 g/dL — ABNORMAL LOW (ref 12.0–15.0)
Immature Granulocytes: 0 %
Lymphocytes Relative: 31 %
Lymphs Abs: 2.4 10*3/uL (ref 0.7–4.0)
MCH: 30.6 pg (ref 26.0–34.0)
MCHC: 32 g/dL (ref 30.0–36.0)
MCV: 95.5 fL (ref 80.0–100.0)
MONO ABS: 0.6 10*3/uL (ref 0.1–1.0)
Monocytes Relative: 8 %
Neutro Abs: 4.5 10*3/uL (ref 1.7–7.7)
Neutrophils Relative %: 57 %
Platelets: 245 10*3/uL (ref 150–400)
RBC: 3.76 MIL/uL — ABNORMAL LOW (ref 3.87–5.11)
RDW: 12.7 % (ref 11.5–15.5)
WBC: 7.8 10*3/uL (ref 4.0–10.5)
nRBC: 0 % (ref 0.0–0.2)

## 2018-09-07 MED ORDER — EXEMESTANE 25 MG PO TABS: ORAL_TABLET | ORAL | 5 refills | Status: DC

## 2018-09-07 MED ORDER — GABAPENTIN 600 MG PO TABS: 600.0000 mg | ORAL_TABLET | Freq: Three times a day (TID) | ORAL | 3 refills | Status: DC

## 2018-09-07 MED ORDER — VENLAFAXINE HCL ER 150 MG PO CP24: ORAL_CAPSULE | ORAL | 5 refills | Status: DC

## 2018-09-07 NOTE — Telephone Encounter (Signed)
Printed avs and calender of upcoming appointment. Per 1/16 los 

## 2018-09-20 ENCOUNTER — Encounter: Payer: Self-pay | Admitting: *Deleted

## 2018-09-20 NOTE — Progress Notes (Signed)
09/20/18 at 4:38pm - The pt called the research nurse to discuss her participation.  The pt was told that she is not eligible currently because she is already on a cognition enhancing drug, methylphenidate. The pt said that she did not want to stop this drug because it is helping her.  She said that she is concerned that she would be randomized to the placebo.  The pt was also told that she is on 2 other medications that can cause some cardiac issues if she is randomized to the study drug.  The pt said that she feels that this study is not right for her.  The pt was thanked for her interest in the trial.  Dr. Burr Medico will be notified that the pt did not meet all of the inclusion/exclusion criteria for study entry.  Brion Aliment RN, BSN, CCRP Clinical Research Nurse 09/20/2018 4:42 PM

## 2018-10-05 ENCOUNTER — Telehealth: Payer: Self-pay | Admitting: *Deleted

## 2018-10-05 NOTE — Telephone Encounter (Signed)
10/05/18 at 3:39pm - The research nurse called the pt to discuss the DCP-001 study.  The research nurse screened failed the pt for the REMEMEBER study on 09/20/18.  The research nurse mailed the pt the DCP consent form and hipaa form to read on 09/21/18 to see if she wanted to participate in the demographic, data study.  The research nurse left the pt a voice mail asking her to return the nurse's call about whether or not she wanted to participate in the trial.  Brion Aliment RN, BSN, CCRP Clinical Research Nurse 10/05/2018 3:43 PM

## 2019-02-22 ENCOUNTER — Telehealth: Payer: Self-pay | Admitting: Hematology

## 2019-02-22 NOTE — Telephone Encounter (Signed)
Per sch msg, called patient to reschedule 5/6 appt for phone visit or push appt one month out. No answer from patient. Scheduled appt one month out. Left msg with new time and date. Mailed printout.

## 2019-03-08 ENCOUNTER — Other Ambulatory Visit: Payer: BLUE CROSS/BLUE SHIELD

## 2019-03-08 ENCOUNTER — Ambulatory Visit: Payer: BLUE CROSS/BLUE SHIELD | Admitting: Hematology

## 2019-03-15 ENCOUNTER — Other Ambulatory Visit: Payer: BLUE CROSS/BLUE SHIELD

## 2019-03-29 ENCOUNTER — Other Ambulatory Visit: Payer: Self-pay | Admitting: Hematology

## 2019-03-29 DIAGNOSIS — C50112 Malignant neoplasm of central portion of left female breast: Secondary | ICD-10-CM

## 2019-03-29 DIAGNOSIS — Z170 Estrogen receptor positive status [ER+]: Secondary | ICD-10-CM

## 2019-04-05 ENCOUNTER — Other Ambulatory Visit: Payer: BLUE CROSS/BLUE SHIELD

## 2019-04-07 ENCOUNTER — Other Ambulatory Visit: Payer: BLUE CROSS/BLUE SHIELD

## 2019-04-07 ENCOUNTER — Ambulatory Visit: Payer: BLUE CROSS/BLUE SHIELD | Admitting: Hematology

## 2019-06-20 ENCOUNTER — Other Ambulatory Visit: Payer: BLUE CROSS/BLUE SHIELD

## 2019-06-26 ENCOUNTER — Telehealth: Payer: Self-pay | Admitting: Hematology

## 2019-06-26 NOTE — Telephone Encounter (Signed)
Called pt per 8/23 sch message - pt wanted to go ahead and reschedule f/u appt to after Mammogram in Oct. R/s appt . Pt is aware of appt date and time

## 2019-06-28 ENCOUNTER — Other Ambulatory Visit: Payer: BC Managed Care – PPO

## 2019-06-28 ENCOUNTER — Ambulatory Visit: Payer: Self-pay | Admitting: Hematology

## 2019-08-31 ENCOUNTER — Other Ambulatory Visit: Payer: Self-pay

## 2019-08-31 ENCOUNTER — Ambulatory Visit
Admission: RE | Admit: 2019-08-31 | Discharge: 2019-08-31 | Disposition: A | Discharge status: Home / Self Care | Payer: BC Managed Care – PPO | Source: Ambulatory Visit | Attending: Hematology | Admitting: Hematology

## 2019-08-31 DIAGNOSIS — C50112 Malignant neoplasm of central portion of left female breast: Secondary | ICD-10-CM

## 2019-08-31 DIAGNOSIS — Z170 Estrogen receptor positive status [ER+]: Secondary | ICD-10-CM

## 2019-08-31 DIAGNOSIS — E2839 Other primary ovarian failure: Secondary | ICD-10-CM

## 2019-09-06 NOTE — Progress Notes (Signed)
[place]   [phone] [phone]   Clinic Follow up Note   Patient Care Team: [name], FNP as PCP - General (Family Medicine)  Date of Service:  09/07/2019  CHIEF COMPLAINT: F/u of left breast cancer   SUMMARY OF ONCOLOGIC HISTORY: Oncology History Overview Note  Cancer Staging Cancer of central portion of left female breast [facility]) Staging form: Breast, AJCC 8th Edition - Pathologic stage from 08/22/2014: Stage IA (pT1b, pN1b, cM0, G1, ER: Positive, PR: Positive, HER2: Negative) - Signed by Truitt Merle, MD on 01/12/2017 - Clinical: No stage assigned - Unsigned     Cancer of central portion of left female breast ([place])  06/11/2014 Mammogram   Bilateral diagnostic mammogram on 06/11/2014 showed a obscured spiculated 2.4 cm mass in the 3:00 position of the left breast. Ultrasound showed a 1.4 x 1.0 x 1.3 cm irregular hypoechoic mass with shadowing in the left breast at the 3:00 position 6 cm from the nipple. Sonographic evaluation the left axilla showed 2 lymph nodes with cortical thickening. The larger lymph node measured 0.8 cm.   06/13/2014 Initial Diagnosis   Cancer of central portion of left female breast ([place])   06/13/2014 Initial Biopsy   Left breast 3:00 biopsy showed invasive tubular carcinoma, grade 1, low-grade DCIS, left axillary lymph node biopsy negative for malignancy.   06/13/2014 Receptors her2   ER and 96% are strongly positive, PR 12% strong positive, HER-2 not amplified, Ki67 4%.   08/22/2014 Surgery   Left breast lumpectomy and sentinel lymph node biopsy    08/22/2014 Pathology Results   Left breast lumpectomy showed invasive tubular carcinoma, 0.8 cm, grade 1, margins were negative, DCIS, intermediate grade. 2 out of 7 sentinel lymph nodes had metastatic carcinoma, tumor deposit measuring 2-5 mm.     - 11/26/2014 Adjuvant Chemotherapy   On 11/26/2014, the patient completed her 4th cycle of Taxotere and Cytoxan  chemotherapy.    12/28/2014 - 02/11/2015 Radiation Therapy   From 12/28/14 - 02/11/15, the patient completed radiation with 50.4 Gy in 28 fractions with a boost of 10 Gy in 5 fractions for a total dose of 60.4 Gy in 33 fractions to the right breast and axilla.   02/25/2015 - 09/18/2015 Anti-estrogen oral therapy   From 02/25/15 - 09/18/15, the patient was on AI therapy with Letrozole by Dr. Sheran Lawless. Discontinued due to intolerable joint pain.   09/18/2015 - 02/24/2016 Anti-estrogen oral therapy   On 09/18/15, the patient was switched to Exemestane due to intolerable joint pain on Letrozole.   02/24/2016 - 03/25/2016 Anti-estrogen oral therapy   Exemestane changed to Tamoxifen due to tolerance issues. Tamoxifen discontinued after 1 month due to continuing joint pain, fatigue, and mood changes.   03/25/2016 -  Anti-estrogen oral therapy   Tamoxifen discontinued due to continuing joint pain, fatigue, and mood changes. The patient was changed back to Exemestane.   02/10/2018 Mammogram   02/10/2018 Mammogram IMPRESSION: No mammographic evidence of malignancy in either breast, status post left lumpectomy.      CURRENT THERAPY:  Exemestane 25 mg 1 tablet daily since May 2017 (initially started Letrozole in 02/2015)  INTERVAL HISTORY:  Jodi Hoover is here for a follow up of left breast cancer. She presents to the clinic alone. She notes she is doing well. She notes she was on weight loss management with her PCP and has purposefully lost significant weight. She notes she is having hard time fighting infections. She will get infections and they will  last a while. She notes she is taking exemestane. She notes she has still been having fatigue and joint pain. She feels this is the medicine she can tolerate. For her sciatica she uses Cymbalta and gemcitabine. She only takes tramadol for severe pain. She notes she already takes calcium and Vit D.  She notes in the past she had a small right breast biopsy  that was benign calcifications. She notes she had a colonoscopy 4 year ago and had no polyps, normal.     REVIEW OF SYSTEMS:   Constitutional: Denies fevers, chills (+) Purposeful weight loss (+) fatigue  Eyes: Denies blurriness of vision Ears, nose, mouth, throat, and face: Denies mucositis or sore throat Respiratory: Denies cough, dyspnea or wheezes Cardiovascular: Denies palpitation, chest discomfort or lower extremity swelling Gastrointestinal:  Denies nausea, heartburn or change in bowel habits Skin: Denies abnormal skin rashes MSK: (+) joint pain  Lymphatics: Denies new lymphadenopathy or easy bruising Neurological:Denies numbness, tingling or new weaknesses (+) Sciatica pain  Behavioral/Psych: Mood is stable, no new changes  All other systems were reviewed with the patient and are negative.  MEDICAL HISTORY:  Past Medical History:  Diagnosis Date  . Atrial tachycardia, paroxysmal ([place])   . Depression   . Heart murmur   . Hypertension   . Personal history of chemotherapy   . Personal history of radiation therapy   . PVC's (premature ventricular contractions)     SURGICAL HISTORY: Past Surgical History:  Procedure Laterality Date  . BREAST EXCISIONAL BIOPSY Right   . BREAST LUMPECTOMY Left 08/2014  . left breast lumpectomy  08/2014    I have reviewed the social history and family history with the patient and they are unchanged from previous note.  ALLERGIES:  is allergic to other.  MEDICATIONS:  Current Outpatient Medications  Medication Sig Dispense Refill  . APPLE CIDER VINEGAR PO Take 1 tablet by mouth daily.    . Biotin 10000 MCG TABS Take by mouth.    . BYSTOLIC 2.5 MG tablet TAKE 1 TABLET DAILY 90 tablet 0  . Calcium-Magnesium-Vitamin D (CALCIUM 500 PO) Take 1 tablet by mouth daily.    . COLLAGEN PO Take 1,000 mg by mouth.    . DULoxetine (CYMBALTA) 20 MG capsule Take 20 mg by mouth daily.    Marland Kitchen exemestane (AROMASIN) 25 MG tablet TAKE ONE TABLET BY MOUTH IN  THE MORNING 30 tablet 11  . ferrous sulfate 325 (65 FE) MG EC tablet Take 65 mg by mouth 3 (three) times daily with meals.    . gabapentin (NEURONTIN) 600 MG tablet Take 1 tablet (600 mg total) by mouth 3 (three) times daily. 90 tablet 3  . lisinopril (PRINIVIL,ZESTRIL) 5 MG tablet Take 5 mg by mouth daily.    . metFORMIN (GLUCOPHAGE) 500 MG tablet Take 500 mg by mouth 2 (two) times daily with a meal.    . methylphenidate (RITALIN) 20 MG tablet Take 1 tablet by mouth daily.    . Methylsulfonylmethane (MSM) 1000 MG CAPS Take 2,000 mg by mouth daily.    . traMADol (ULTRAM) 50 MG tablet Take 1 tablet (50 mg total) by mouth as needed. 20 tablet 0  . venlafaxine XR (EFFEXOR-XR) 150 MG 24 hr capsule TAKE ONE CAPSULE BY MOUTH IN THE MORNING 30 capsule 11   No current facility-administered medications for this visit.     PHYSICAL EXAMINATION: ECOG PERFORMANCE STATUS: 0 - Asymptomatic  Vitals:   09/07/19 0942  BP: (!) 147/84  Pulse: 73  Resp: 17  Temp: 98.7 F (37.1 C)  SpO2: 99%   Filed Weights   09/07/19 0942  Weight: 142 lb 1.6 oz (64.5 kg)    GENERAL:alert, no distress and comfortable SKIN: skin color, texture, turgor are normal, no rashes or significant lesions EYES: normal, Conjunctiva are pink and non-injected, sclera clear  NECK: supple, thyroid normal size, non-tender, without nodularity LYMPH:  no palpable lymphadenopathy in the cervical, axillary  LUNGS: clear to auscultation and percussion with normal breathing effort HEART: regular rate & rhythm and no murmurs and no lower extremity edema ABDOMEN:abdomen soft, non-tender and normal bowel sounds Musculoskeletal:no cyanosis of digits and no clubbing  NEURO: alert & oriented x 3 with fluent speech, no focal motor/sensory deficits BREAST: S/p left lumpectomy: Surgical incision healed well. (+) Right breast biopsy incision, healed (+) Right breast 2x3cm Lump at 11:00 position, 4cm from nipple. No adenopathy bilaterally. Breast  exam benign.   LABORATORY DATA:  I have reviewed the data as listed CBC Latest Ref Rng & Units 09/07/2019 09/07/2018 01/17/2018  WBC 4.0 - 10.5 K/uL 7.1 7.8 8.2  Hemoglobin 12.0 - 15.0 g/dL 11.8(L) 11.5(L) 12.3  Hematocrit 36.0 - 46.0 % 36.3 35.9(L) 37.6  Platelets 150 - 400 K/uL 231 245 244     CMP Latest Ref Rng & Units 09/07/2019 09/07/2018 01/17/2018  Glucose 70 - 99 mg/dL 94 91 88  BUN 6 - 20 mg/dL 15 9 10   Creatinine 0.44 - 1.00 mg/dL 0.86 0.84 0.90  Sodium 135 - 145 mmol/L 137 143 139  Potassium 3.5 - 5.1 mmol/L 4.6 3.9 4.2  Chloride 98 - 111 mmol/L 103 110 105  CO2 22 - 32 mmol/L 25 24 25   Calcium 8.9 - 10.3 mg/dL 9.4 9.3 9.6  Total Protein 6.5 - 8.1 g/dL 7.0 6.9 7.2  Total Bilirubin 0.3 - 1.2 mg/dL 0.8 0.3 0.4  Alkaline Phos 38 - 126 U/L 76 66 72  AST 15 - 41 U/L 22 14(L) 14  ALT 0 - 44 U/L 19 13 12       RADIOGRAPHIC STUDIES: I have personally reviewed the radiological images as listed and agreed with the findings in the report. No results found.   ASSESSMENT & PLAN:  Jodi Hoover is a 58 y.o. female with   1. 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 She is s/p left lumpectomy, adjuvant chemo TC, adjuvant Radiation therapy. She has tried several AI since starting in 02/2015. She is currently on Exemestane tolerating well with manageable hot flashes and arthralgia on Effexor. Plan to complete 7-10 years in total.  -She is clinically doing well. Lab reviewed, her CBC and CMP are within normal limits except mild anemia. Her physical exam today showed right breast biopsy incision, healed and 2x3cm lump of her right breast. Her 08/2019 mammogram were unremarkable. There is no clinical concern for recurrence. -Will obtain Right breast US for further evaluation. She is agreeable.  -Continue surveillance. Next mammogram in 08/2020 -Continue  exemestane  -F/u in 1 year. I encouraged her to find a PCP to see in interim.    2. Smoking Cessation  -I previously counseled the patient about smoking cessation. -She is interested in quitting, she has tried various methods to quit, was not successful.   3. Hyperglycemia, HTN -Continue medications and f/u with PCP. Mostly controlled   4. Arthralgia and hot flash -She continues  to have moderate to severe hot flashes and joint pain with exemestane. She is currently taking Effexor 150 mg daily and Tramadol 50 mg as needed.  -Her hot flash is manageable on higher dose of Effexor.  5. Osteopenia  -Past DEXA years ago. 08/31/19 DEXA shows osteopenia with lowest T-score -1.8 at [facility] spine -I encouraged her continue calcium and vitamin D  6. New onset mild anemia in 09/2018 -She is currently taking prenatal vitamin -Pt notes she had a colonocopy 4 year ago and had no polyps, normal.   -Anemia mild and stable since new onset. I recommend she see PCP to monitor labs.   7. Memory loss  -She previously complained of significant memory loss, especially short-term memory, since her chemotherapy -We previously screened her for our clinical trial "REMEMBER" in 09/2018.   8. Sciatica Pain -She takes gabapentin, Cymbalta and takes tramadol for severe pain only  -Controlled.    PLAN -I refilled Tramadol, Exemestane, Effexor today  -Right breast US next week -Continue exemestane -Mammogram in 08/2020 -Lab and f/u in 1 year with NP Lacie    No problem-specific Assessment & Plan notes found for this encounter.   Orders Placed This Encounter  Procedures  . US BREAST LTD UNI RIGHT INC AXILLA    Standing Status:   Future    Standing Expiration Date:   11/06/2020    Order Specific Question:   Reason for Exam (SYMPTOM  OR DIAGNOSIS REQUIRED)    Answer:   evaluate right breast lump 2X3cm at 11:00 position, 4cm from nipple    Order Specific Question:   Preferred imaging location?     Answer:   [facility]   All questions were answered. The patient knows to call the clinic with any problems, questions or concerns. No barriers to learning was detected.      Truitt Merle, MD 09/07/2019   I, Joslyn Devon, am acting as scribe for Truitt Merle, MD.   I have reviewed the above documentation for accuracy and completeness, and I agree with the above.

## 2019-09-07 ENCOUNTER — Inpatient Hospital Stay (HOSPITAL_BASED_OUTPATIENT_CLINIC_OR_DEPARTMENT_OTHER): Payer: BC Managed Care – PPO | Admitting: Hematology

## 2019-09-07 ENCOUNTER — Telehealth: Payer: Self-pay | Admitting: Hematology

## 2019-09-07 ENCOUNTER — Encounter: Payer: Self-pay | Admitting: Hematology

## 2019-09-07 ENCOUNTER — Other Ambulatory Visit: Payer: Self-pay

## 2019-09-07 ENCOUNTER — Inpatient Hospital Stay: Payer: BC Managed Care – PPO | Attending: Hematology

## 2019-09-07 VITALS — BP 147/84 | HR 73 | Temp 98.7°F | Resp 17 | Ht 62.0 in | Wt 142.1 lb

## 2019-09-07 DIAGNOSIS — R413 Other amnesia: Secondary | ICD-10-CM | POA: Diagnosis not present

## 2019-09-07 DIAGNOSIS — G8929 Other chronic pain: Secondary | ICD-10-CM | POA: Diagnosis not present

## 2019-09-07 DIAGNOSIS — C50112 Malignant neoplasm of central portion of left female breast: Secondary | ICD-10-CM | POA: Diagnosis not present

## 2019-09-07 DIAGNOSIS — M8580 Other specified disorders of bone density and structure, unspecified site: Secondary | ICD-10-CM | POA: Diagnosis not present

## 2019-09-07 DIAGNOSIS — E119 Type 2 diabetes mellitus without complications: Secondary | ICD-10-CM | POA: Diagnosis not present

## 2019-09-07 DIAGNOSIS — Z923 Personal history of irradiation: Secondary | ICD-10-CM | POA: Diagnosis not present

## 2019-09-07 DIAGNOSIS — Z79811 Long term (current) use of aromatase inhibitors: Secondary | ICD-10-CM | POA: Insufficient documentation

## 2019-09-07 DIAGNOSIS — Z170 Estrogen receptor positive status [ER+]: Secondary | ICD-10-CM

## 2019-09-07 DIAGNOSIS — Z9221 Personal history of antineoplastic chemotherapy: Secondary | ICD-10-CM | POA: Insufficient documentation

## 2019-09-07 DIAGNOSIS — R5383 Other fatigue: Secondary | ICD-10-CM | POA: Insufficient documentation

## 2019-09-07 DIAGNOSIS — R232 Flushing: Secondary | ICD-10-CM | POA: Insufficient documentation

## 2019-09-07 DIAGNOSIS — I10 Essential (primary) hypertension: Secondary | ICD-10-CM

## 2019-09-07 DIAGNOSIS — Z7984 Long term (current) use of oral hypoglycemic drugs: Secondary | ICD-10-CM | POA: Insufficient documentation

## 2019-09-07 DIAGNOSIS — N951 Menopausal and female climacteric states: Secondary | ICD-10-CM | POA: Diagnosis not present

## 2019-09-07 DIAGNOSIS — Z79899 Other long term (current) drug therapy: Secondary | ICD-10-CM | POA: Insufficient documentation

## 2019-09-07 DIAGNOSIS — F329 Major depressive disorder, single episode, unspecified: Secondary | ICD-10-CM | POA: Diagnosis not present

## 2019-09-07 DIAGNOSIS — R634 Abnormal weight loss: Secondary | ICD-10-CM | POA: Diagnosis not present

## 2019-09-07 DIAGNOSIS — D649 Anemia, unspecified: Secondary | ICD-10-CM | POA: Insufficient documentation

## 2019-09-07 DIAGNOSIS — M2550 Pain in unspecified joint: Secondary | ICD-10-CM | POA: Insufficient documentation

## 2019-09-07 LAB — CBC WITH DIFFERENTIAL/PLATELET
Abs Immature Granulocytes: 0.01 10*3/uL (ref 0.00–0.07)
Basophils Absolute: 0.1 10*3/uL (ref 0.0–0.1)
Basophils Relative: 1 %
Eosinophils Absolute: 0.3 10*3/uL (ref 0.0–0.5)
Eosinophils Relative: 5 %
HCT: 36.3 % (ref 36.0–46.0)
Hemoglobin: 11.8 g/dL — ABNORMAL LOW (ref 12.0–15.0)
Immature Granulocytes: 0 %
Lymphocytes Relative: 32 %
Lymphs Abs: 2.3 10*3/uL (ref 0.7–4.0)
MCH: 31 pg (ref 26.0–34.0)
MCHC: 32.5 g/dL (ref 30.0–36.0)
MCV: 95.3 fL (ref 80.0–100.0)
Monocytes Absolute: 0.7 10*3/uL (ref 0.1–1.0)
Monocytes Relative: 10 %
Neutro Abs: 3.7 10*3/uL (ref 1.7–7.7)
Neutrophils Relative %: 52 %
Platelets: 231 10*3/uL (ref 150–400)
RBC: 3.81 MIL/uL — ABNORMAL LOW (ref 3.87–5.11)
RDW: 12.6 % (ref 11.5–15.5)
WBC: 7.1 10*3/uL (ref 4.0–10.5)
nRBC: 0 % (ref 0.0–0.2)

## 2019-09-07 LAB — COMPREHENSIVE METABOLIC PANEL
ALT: 19 U/L (ref 0–44)
AST: 22 U/L (ref 15–41)
Albumin: 4.2 g/dL (ref 3.5–5.0)
Alkaline Phosphatase: 76 U/L (ref 38–126)
Anion gap: 9 (ref 5–15)
BUN: 15 mg/dL (ref 6–20)
CO2: 25 mmol/L (ref 22–32)
Calcium: 9.4 mg/dL (ref 8.9–10.3)
Chloride: 103 mmol/L (ref 98–111)
Creatinine, Ser: 0.86 mg/dL (ref 0.44–1.00)
GFR calc Af Amer: >60 mL/min (ref >60)
GFR calc non Af Amer: >60 mL/min (ref >60)
Glucose, Bld: 94 mg/dL (ref 70–99)
Potassium: 4.6 mmol/L (ref 3.5–5.1)
Sodium: 137 mmol/L (ref 135–145)
Total Bilirubin: 0.8 mg/dL (ref 0.3–1.2)
Total Protein: 7 g/dL (ref 6.5–8.1)

## 2019-09-07 MED ORDER — TRAMADOL HCL 50 MG PO TABS: 50.0000 mg | ORAL_TABLET | ORAL | 0 refills | Status: DC | PRN

## 2019-09-07 MED ORDER — EXEMESTANE 25 MG PO TABS: ORAL_TABLET | ORAL | 11 refills | Status: DC

## 2019-09-07 MED ORDER — VENLAFAXINE HCL ER 150 MG PO CP24: ORAL_CAPSULE | ORAL | 11 refills | Status: DC

## 2019-09-07 NOTE — Telephone Encounter (Signed)
Scheduled appt per 11/5 los.  Spoke with pt and they are aware of the appt date and time.

## 2019-09-08 LAB — CANCER ANTIGEN 27.29: CA 27.29: 24.3 U/mL (ref 0.0–38.6)

## 2019-09-11 ENCOUNTER — Telehealth: Payer: Self-pay | Admitting: *Deleted

## 2019-09-11 NOTE — Telephone Encounter (Signed)
Notified of message below

## 2019-09-11 NOTE — Telephone Encounter (Signed)
-----   Message from Jonnie Finner, RN sent at 09/11/2019  8:32 AM EST -----  ----- Message ----- From: Truitt Merle, MD Sent: 09/08/2019  11:00 PM EST To: [facility]  Please let pt know her tumor marker and CMP were WNL, thanks   Truitt Merle

## 2019-09-18 ENCOUNTER — Ambulatory Visit
Admission: RE | Admit: 2019-09-18 | Discharge: 2019-09-18 | Disposition: A | Discharge status: Home / Self Care | Payer: BC Managed Care – PPO | Source: Ambulatory Visit | Attending: Hematology | Admitting: Hematology

## 2019-09-18 ENCOUNTER — Other Ambulatory Visit: Payer: Self-pay

## 2019-09-18 DIAGNOSIS — Z170 Estrogen receptor positive status [ER+]: Secondary | ICD-10-CM

## 2019-09-18 DIAGNOSIS — C50112 Malignant neoplasm of central portion of left female breast: Secondary | ICD-10-CM

## 2020-02-10 ENCOUNTER — Ambulatory Visit: Payer: BC Managed Care – PPO | Attending: Internal Medicine

## 2020-02-10 DIAGNOSIS — Z23 Encounter for immunization: Secondary | ICD-10-CM

## 2020-02-10 NOTE — Progress Notes (Signed)
   Covid-19 Vaccination Clinic  Name:  Jodi Hoover    MRN: [id number] DOB: 03/12/1961  02/10/2020  [name] was observed post Covid-19 immunization for 15 minutes without incident. She was provided with Vaccine Information Sheet and instruction to access the V-Safe system.   [name] was instructed to call 911 with any severe reactions post vaccine: Marland Kitchen Difficulty breathing  . Swelling of face and throat  . A fast heartbeat  . A bad rash all over body  . Dizziness and weakness   Immunizations Administered    Name Date Dose VIS Date Route   Pfizer COVID-19 Vaccine 02/10/2020  9:02 AM 0.3 mL 10/13/2019 Intramuscular   Manufacturer: [place]   Lot: [id number]   [place]: [id number]

## 2020-03-05 ENCOUNTER — Ambulatory Visit: Payer: BC Managed Care – PPO | Attending: Internal Medicine

## 2020-03-05 DIAGNOSIS — Z23 Encounter for immunization: Secondary | ICD-10-CM

## 2020-03-05 NOTE — Progress Notes (Signed)
   Covid-19 Vaccination Clinic  Name:  Jodi Hoover    MRN: [id number] DOB: 1961-08-11  03/05/2020  Jodi Hoover was observed post Covid-19 immunization for 15 minutes without incident. She was provided with Vaccine Information Sheet and instruction to access the V-Safe system.   Jodi Hoover was instructed to call 911 with any severe reactions post vaccine: Marland Kitchen Difficulty breathing  . Swelling of face and throat  . A fast heartbeat  . A bad rash all over body  . Dizziness and weakness   Immunizations Administered    Name Date Dose VIS Date Route   Pfizer COVID-19 Vaccine 03/05/2020  9:02 AM 0.3 mL 12/27/2018 Intramuscular   Manufacturer: [place]   Lot: [id number]   [place]: [id number]

## 2020-09-05 ENCOUNTER — Inpatient Hospital Stay: Payer: Self-pay | Admitting: Hematology

## 2020-09-05 ENCOUNTER — Inpatient Hospital Stay: Payer: Self-pay

## 2020-09-19 ENCOUNTER — Other Ambulatory Visit: Payer: Self-pay | Admitting: Hematology

## 2020-09-19 DIAGNOSIS — Z170 Estrogen receptor positive status [ER+]: Secondary | ICD-10-CM

## 2020-09-19 DIAGNOSIS — C50112 Malignant neoplasm of central portion of left female breast: Secondary | ICD-10-CM

## 2020-09-24 ENCOUNTER — Other Ambulatory Visit: Payer: Self-pay

## 2020-09-24 DIAGNOSIS — Z170 Estrogen receptor positive status [ER+]: Secondary | ICD-10-CM

## 2020-09-24 MED ORDER — EXEMESTANE 25 MG PO TABS: ORAL_TABLET | ORAL | 3 refills | Status: DC

## 2020-09-24 MED ORDER — VENLAFAXINE HCL ER 150 MG PO CP24: ORAL_CAPSULE | ORAL | 3 refills | Status: DC

## 2020-09-24 NOTE — Progress Notes (Signed)
Pt called requesting refills for exemestane and venlafaxine.  She states she does not have insurance at this time but will after 11/02/2020.  She will call and make appt then.

## 2020-09-25 ENCOUNTER — Other Ambulatory Visit: Payer: Self-pay | Admitting: Hematology

## 2020-09-25 DIAGNOSIS — Z170 Estrogen receptor positive status [ER+]: Secondary | ICD-10-CM

## 2020-09-25 DIAGNOSIS — C50112 Malignant neoplasm of central portion of left female breast: Secondary | ICD-10-CM

## 2020-09-28 ENCOUNTER — Other Ambulatory Visit: Payer: Self-pay | Admitting: Hematology

## 2020-09-28 DIAGNOSIS — Z170 Estrogen receptor positive status [ER+]: Secondary | ICD-10-CM

## 2020-11-15 ENCOUNTER — Other Ambulatory Visit: Payer: Self-pay | Admitting: Hematology

## 2020-11-15 DIAGNOSIS — Z1231 Encounter for screening mammogram for malignant neoplasm of breast: Secondary | ICD-10-CM

## 2020-11-20 ENCOUNTER — Ambulatory Visit: Payer: Self-pay

## 2020-12-03 ENCOUNTER — Telehealth: Payer: Self-pay | Admitting: Hematology

## 2020-12-03 NOTE — Telephone Encounter (Addendum)
Returned call to schedule an appointment per 2/1 schedule message. Patient requested to see provider after her mammogram appointment and requested medication refill. Sent request to provider's nurse.

## 2020-12-05 ENCOUNTER — Telehealth: Payer: Self-pay | Admitting: Hematology

## 2020-12-05 NOTE — Telephone Encounter (Signed)
Scheduled follow-up appointment per 2/1 schedule message. Patient is aware. 

## 2020-12-06 ENCOUNTER — Telehealth: Payer: Self-pay | Admitting: Hematology

## 2020-12-06 ENCOUNTER — Inpatient Hospital Stay: Payer: 59

## 2020-12-06 ENCOUNTER — Encounter: Payer: Self-pay | Admitting: Hematology

## 2020-12-06 ENCOUNTER — Inpatient Hospital Stay: Payer: 59 | Attending: Hematology | Admitting: Hematology

## 2020-12-06 ENCOUNTER — Other Ambulatory Visit: Payer: Self-pay

## 2020-12-06 VITALS — BP 135/83 | HR 70 | Temp 99.0°F | Resp 16 | Ht 62.0 in | Wt 154.5 lb

## 2020-12-06 DIAGNOSIS — Z87891 Personal history of nicotine dependence: Secondary | ICD-10-CM

## 2020-12-06 DIAGNOSIS — M8580 Other specified disorders of bone density and structure, unspecified site: Secondary | ICD-10-CM | POA: Diagnosis not present

## 2020-12-06 DIAGNOSIS — I10 Essential (primary) hypertension: Secondary | ICD-10-CM | POA: Insufficient documentation

## 2020-12-06 DIAGNOSIS — C50112 Malignant neoplasm of central portion of left female breast: Secondary | ICD-10-CM | POA: Diagnosis present

## 2020-12-06 DIAGNOSIS — Z9221 Personal history of antineoplastic chemotherapy: Secondary | ICD-10-CM | POA: Diagnosis not present

## 2020-12-06 DIAGNOSIS — Z79811 Long term (current) use of aromatase inhibitors: Secondary | ICD-10-CM | POA: Insufficient documentation

## 2020-12-06 DIAGNOSIS — Z79899 Other long term (current) drug therapy: Secondary | ICD-10-CM | POA: Diagnosis not present

## 2020-12-06 DIAGNOSIS — F17210 Nicotine dependence, cigarettes, uncomplicated: Secondary | ICD-10-CM | POA: Insufficient documentation

## 2020-12-06 DIAGNOSIS — Z923 Personal history of irradiation: Secondary | ICD-10-CM | POA: Insufficient documentation

## 2020-12-06 DIAGNOSIS — Z170 Estrogen receptor positive status [ER+]: Secondary | ICD-10-CM | POA: Diagnosis not present

## 2020-12-06 DIAGNOSIS — R739 Hyperglycemia, unspecified: Secondary | ICD-10-CM | POA: Insufficient documentation

## 2020-12-06 DIAGNOSIS — D649 Anemia, unspecified: Secondary | ICD-10-CM | POA: Insufficient documentation

## 2020-12-06 DIAGNOSIS — E2839 Other primary ovarian failure: Secondary | ICD-10-CM | POA: Diagnosis not present

## 2020-12-06 LAB — CBC WITH DIFFERENTIAL/PLATELET
Abs Immature Granulocytes: 0.02 10*3/uL (ref 0.00–0.07)
Basophils Absolute: 0.1 10*3/uL (ref 0.0–0.1)
Basophils Relative: 1 %
Eosinophils Absolute: 0.5 10*3/uL (ref 0.0–0.5)
Eosinophils Relative: 7 %
HCT: 36.8 % (ref 36.0–46.0)
Hemoglobin: 11.8 g/dL — ABNORMAL LOW (ref 12.0–15.0)
Immature Granulocytes: 0 %
Lymphocytes Relative: 35 %
Lymphs Abs: 2.7 10*3/uL (ref 0.7–4.0)
MCH: 30.6 pg (ref 26.0–34.0)
MCHC: 32.1 g/dL (ref 30.0–36.0)
MCV: 95.6 fL (ref 80.0–100.0)
Monocytes Absolute: 0.8 10*3/uL (ref 0.1–1.0)
Monocytes Relative: 11 %
Neutro Abs: 3.5 10*3/uL (ref 1.7–7.7)
Neutrophils Relative %: 46 %
Platelets: 264 10*3/uL (ref 150–400)
RBC: 3.85 MIL/uL — ABNORMAL LOW (ref 3.87–5.11)
RDW: 13.2 % (ref 11.5–15.5)
WBC: 7.7 10*3/uL (ref 4.0–10.5)
nRBC: 0 % (ref 0.0–0.2)

## 2020-12-06 LAB — COMPREHENSIVE METABOLIC PANEL
ALT: 31 U/L (ref 0–44)
AST: 32 U/L (ref 15–41)
Albumin: 4.1 g/dL (ref 3.5–5.0)
Alkaline Phosphatase: 78 U/L (ref 38–126)
Anion gap: 5 (ref 5–15)
BUN: 19 mg/dL (ref 6–20)
CO2: 23 mmol/L (ref 22–32)
Calcium: 9.3 mg/dL (ref 8.9–10.3)
Chloride: 110 mmol/L (ref 98–111)
Creatinine, Ser: 0.93 mg/dL (ref 0.44–1.00)
GFR, Estimated: >60 mL/min (ref >60)
Glucose, Bld: 86 mg/dL (ref 70–99)
Potassium: 4.3 mmol/L (ref 3.5–5.1)
Sodium: 138 mmol/L (ref 135–145)
Total Bilirubin: 0.5 mg/dL (ref 0.3–1.2)
Total Protein: 7.2 g/dL (ref 6.5–8.1)

## 2020-12-06 MED ORDER — EXEMESTANE 25 MG PO TABS: ORAL_TABLET | ORAL | 3 refills | Status: DC

## 2020-12-06 NOTE — Telephone Encounter (Signed)
Scheduled appointments per 2/4 los. Spoke to patient who is aware of appointments dates and times.  

## 2020-12-06 NOTE — Progress Notes (Signed)
Jodi Hoover   [phone] Fax:([phone]) [phone]   Clinic Follow up Note   Patient Care Team: [name], FNP as PCP - General (Family Medicine)  Date of Service:  12/06/2020  CHIEF COMPLAINT: F/u of left breast cancer   SUMMARY OF ONCOLOGIC HISTORY: Oncology History Overview Note  Cancer Staging Cancer of central portion of left female breast [facility]) Staging form: Breast, AJCC 8th Edition - Pathologic stage from 08/22/2014: Stage IA (pT1b, pN1b, cM0, G1, ER: Positive, PR: Positive, HER2: Negative) - Signed by Truitt Merle, MD on 01/12/2017 - Clinical: No stage assigned - Unsigned     Cancer of central portion of left female breast ([place])  06/11/2014 Mammogram   Bilateral diagnostic mammogram on 06/11/2014 showed a obscured spiculated 2.4 cm mass in the 3:00 position of the left breast. Ultrasound showed a 1.4 x 1.0 x 1.3 cm irregular hypoechoic mass with shadowing in the left breast at the 3:00 position 6 cm from the nipple. Sonographic evaluation the left axilla showed 2 lymph nodes with cortical thickening. The larger lymph node measured 0.8 cm.   06/13/2014 Initial Diagnosis   Cancer of central portion of left female breast ([place])   06/13/2014 Initial Biopsy   Left breast 3:00 biopsy showed invasive tubular carcinoma, grade 1, low-grade DCIS, left axillary lymph node biopsy negative for malignancy.   06/13/2014 Receptors her2   ER and 96% are strongly positive, PR 12% strong positive, HER-2 not amplified, Ki67 4%.   08/22/2014 Surgery   Left breast lumpectomy and sentinel lymph node biopsy    08/22/2014 Pathology Results   Left breast lumpectomy showed invasive tubular carcinoma, 0.8 cm, grade 1, margins were negative, DCIS, intermediate grade. 2 out of 7 sentinel lymph nodes had metastatic carcinoma, tumor deposit measuring 2-5 mm.     - 11/26/2014 Adjuvant Chemotherapy   On 11/26/2014, the patient completed her 4th cycle of Taxotere and Cytoxan  chemotherapy.    12/28/2014 - 02/11/2015 Radiation Therapy   From 12/28/14 - 02/11/15, the patient completed radiation with 50.4 Gy in 28 fractions with a boost of 10 Gy in 5 fractions for a total dose of 60.4 Gy in 33 fractions to the right breast and axilla.   02/25/2015 - 09/18/2015 Anti-estrogen oral therapy   From 02/25/15 - 09/18/15, the patient was on AI therapy with Letrozole by Dr. Sheran Lawless. Discontinued due to intolerable joint pain.   09/18/2015 - 02/24/2016 Anti-estrogen oral therapy   On 09/18/15, the patient was switched to Exemestane due to intolerable joint pain on Letrozole.   02/24/2016 - 03/25/2016 Anti-estrogen oral therapy   Exemestane changed to Tamoxifen due to tolerance issues. Tamoxifen discontinued after 1 month due to continuing joint pain, fatigue, and mood changes.   03/25/2016 -  Anti-estrogen oral therapy   Tamoxifen discontinued due to continuing joint pain, fatigue, and mood changes. The patient was changed back to Exemestane.   02/10/2018 Mammogram   02/10/2018 Mammogram IMPRESSION: No mammographic evidence of malignancy in either breast, status post left lumpectomy.      CURRENT THERAPY:  Currently on Exemestane 49m once daily   INTERVAL HISTORY:  Jodi Latimoreis here for a follow up of left breast cancer. She was last seen by me in 09/2019. She presents to the clinic alone. She notes she is doing well. She notes she lost follow due to changes in insurance. She is currently on exemestane and tolerating well. She denies joint pain. [place] by me in this office. She notes she is still trying to quit smoking and currently smokes 1ppd.     REVIEW OF SYSTEMS:   Constitutional: Denies fevers, chills or abnormal weight loss Eyes: Denies blurriness of vision Ears, nose, mouth, throat, and face: Denies mucositis or sore throat Respiratory: Denies cough, dyspnea or wheezes Cardiovascular:  Denies palpitation, chest discomfort or lower extremity swelling Gastrointestinal:  Denies nausea, heartburn or change in bowel habits Skin: Denies abnormal skin rashes Lymphatics: Denies new lymphadenopathy or easy bruising Neurological:Denies numbness, tingling or new weaknesses Behavioral/Psych: Mood is stable, no new changes  All other systems were reviewed with the patient and are negative.  MEDICAL HISTORY:  Past Medical History:  Diagnosis Date  . Atrial tachycardia, paroxysmal ([place])   . Depression   . Heart murmur   . Hypertension   . Personal history of chemotherapy   . Personal history of radiation therapy   . PVC's (premature ventricular contractions)     SURGICAL HISTORY: Past Surgical History:  Procedure Laterality Date  . BREAST EXCISIONAL BIOPSY Right   . BREAST LUMPECTOMY Left 08/2014  . left breast lumpectomy  08/2014    I have reviewed the social history and family history with the patient and they are unchanged from previous note.  ALLERGIES:  is allergic to other.  MEDICATIONS:  Current Outpatient Medications  Medication Sig Dispense Refill  . Biotin 10000 MCG TABS Take by mouth.    . Calcium-Magnesium-Vitamin D (CALCIUM 500 PO) Take 1 tablet by mouth daily.    . COLLAGEN PO Take 1,000 mg by mouth.    Marland Kitchen exemestane (AROMASIN) 25 MG tablet TAKE ONE TABLET BY MOUTH IN THE MORNING 90 tablet 3  . ferrous sulfate 325 (65 FE) MG EC tablet Take 65 mg by mouth 3 (three) times daily with meals.    . gabapentin (NEURONTIN) 600 MG tablet Take 1 tablet (600 mg total) by mouth 3 (three) times daily. 90 tablet 3  . lisinopril (PRINIVIL,ZESTRIL) 5 MG tablet Take 5 mg by mouth daily.    . metFORMIN (GLUCOPHAGE) 500 MG tablet Take 500 mg by mouth 2 (two) times daily with a meal.    . Methylsulfonylmethane (MSM) 1000 MG CAPS Take 2,000 mg by mouth daily.    . traMADol (ULTRAM) 50 MG tablet Take 1 tablet (50 mg total) by mouth as needed. 20 tablet 0  . venlafaxine XR  (EFFEXOR-XR) 150 MG 24 hr capsule TAKE ONE CAPSULE BY MOUTH IN THE MORNING 30 capsule 3   No current facility-administered medications for this visit.    PHYSICAL EXAMINATION: ECOG PERFORMANCE STATUS: 0 - Asymptomatic  Vitals:   12/06/20 1332  BP: 135/83  Pulse: 70  Resp: 16  Temp: 99 F (37.2 C)  SpO2: 98%   Filed Weights   12/06/20 1332  Weight: 154 lb 8 oz (70.1 kg)    GENERAL:alert, no distress and comfortable SKIN: skin color, texture, turgor are normal, no rashes or significant lesions EYES: normal, Conjunctiva are pink and non-injected, sclera clear  NECK: supple, thyroid normal size, non-tender, without nodularity LYMPH:  no palpable lymphadenopathy in the cervical, axillary  LUNGS: clear to auscultation and percussion with normal breathing effort HEART: regular rate & rhythm and no murmurs and no lower extremity edema ABDOMEN:abdomen soft, non-tender and normal bowel sounds Musculoskeletal:no cyanosis of digits and no clubbing  NEURO: alert & oriented x 3 with fluent speech, no focal motor/sensory deficits BREAST: s/p left lumpectomy: Surgical  incision healed well with scar tissue (+) Known palpable right breast 1x2.5cm lump in RUQ  LABORATORY DATA:  I have reviewed the data as listed CBC Latest Ref Rng & Units 12/06/2020 09/07/2019 09/07/2018  WBC 4.0 - 10.5 K/uL 7.7 7.1 7.8  Hemoglobin 12.0 - 15.0 g/dL 11.8(L) 11.8(L) 11.5(L)  Hematocrit 36.0 - 46.0 % 36.8 36.3 35.9(L)  Platelets 150 - 400 K/uL 264 231 245     CMP Latest Ref Rng & Units 12/06/2020 09/07/2019 09/07/2018  Glucose 70 - 99 mg/dL 86 94 91  BUN 6 - 20 mg/dL 19 15 9   Creatinine 0.44 - 1.00 mg/dL 0.93 0.86 0.84  Sodium 135 - 145 mmol/L 138 137 143  Potassium 3.5 - 5.1 mmol/L 4.3 4.6 3.9  Chloride 98 - 111 mmol/L 110 103 110  CO2 22 - 32 mmol/L 23 25 24   Calcium 8.9 - 10.3 mg/dL 9.3 9.4 9.3  Total Protein 6.5 - 8.1 g/dL 7.2 7.0 6.9  Total Bilirubin 0.3 - 1.2 mg/dL 0.5 0.8 0.3  Alkaline Phos 38 - 126  U/L 78 76 66  AST 15 - 41 U/L 32 22 14(L)  ALT 0 - 44 U/L 31 19 13       RADIOGRAPHIC STUDIES: I have personally reviewed the radiological images as listed and agreed with the findings in the report. No results found.   ASSESSMENT & PLAN:  Jodi Hoover is a 60 y.o. female with   1. 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 She is s/p left lumpectomy, adjuvant chemo TC, adjuvant Radiation therapy. She has tried several AI since starting in 02/2015. She is currently on Exemestane tolerating well with manageable hot flashes on Effexor. Plan to complete 7-10 years in total.  -She is clinically doing well. Lab reviewed, her CBC and CMP are within normal limits except Hg 11.8. Her physical exam was unremarkable except known right breast lump. There is no clinical concern for recurrence.  -Continue surveillance. Next mammogram on 01/02/21 -Continue exemestane  -F/u in 1 year with NP Lacie    2. Smoking Cessation  -She has been smoking for over 30 years.  -She notes she is trying to quit smoking, but still smokes 1PPD. I again counseled the patient about smoking cessation. I discussed option of [facility] smoking cessation clinic, she declined.  -She is interested in Lung cancer screening, she is agreeable. Will proceed with CT chest next month.  -I recommend she proceed with PNA and Flu shot and after mammogram she can proceed with COVID vaccines.    3. Hyperglycemia, HTN -She notes she is looking for new PCP under new insurance.   4. Osteopenia  -Past DEXA years ago. 08/31/19 DEXA shows osteopenia with lowest T-score -1.8 at [facility] -I encouraged her continue calcium and vitamin D -She is due for DEXA in 08/2021.   5. Mild anemia  -She has had mild anemia since 09/2018. -Last colonoscopy was over 4 year ago and had no polyps, normal.   -She is currently  taking prenatal vitamin. Has remained mild and stable.   PLAN  -Continue exemestane, refilled today -DEXA in 08/2021 at [place] on 01/02/21 -CT chest in at [facility] in 1 month for lung cancer screening  -Lab and f/u in 1 year with NP Lacie    No problem-specific Assessment & Plan notes found for this encounter.  Orders Placed This Encounter  Procedures  . DG Bone Density    Standing Status:   Future    Standing Expiration Date:   12/06/2021    Order Specific Question:   Reason for Exam (SYMPTOM  OR DIAGNOSIS REQUIRED)    Answer:   screening    Order Specific Question:   Is the patient pregnant?    Answer:   No    Order Specific Question:   Preferred imaging location?    Answer:   [facility]  . CT CHEST LUNG CA SCREEN LOW DOSE W/O CM    Standing Status:   Future    Standing Expiration Date:   12/06/2021    Order Specific Question:   Reason for Exam (SYMPTOM  OR DIAGNOSIS REQUIRED)    Answer:   no symptoms    Order Specific Question:   Preferred Imaging Location?    Answer:   [facility]    Order Specific Question:   Release to patient    Answer:   Immediate    Order Specific Question:   Is patient pregnant?    Answer:   No   All questions were answered. The patient knows to call the clinic with any problems, questions or concerns. No barriers to learning was detected. The total time spent in the appointment was 30 minutes.     Truitt Merle, MD 12/06/2020   I, Joslyn Devon, am acting as scribe for Truitt Merle, MD.   I have reviewed the above documentation for accuracy and completeness, and I agree with the above.

## 2020-12-23 ENCOUNTER — Telehealth: Payer: Self-pay | Admitting: Nurse Practitioner

## 2020-12-23 NOTE — Telephone Encounter (Signed)
Changed upcoming appointment to virtual and moved per provider's template. Patient is aware of changes.

## 2021-01-02 ENCOUNTER — Ambulatory Visit
Admission: RE | Admit: 2021-01-02 | Discharge: 2021-01-02 | Disposition: A | Discharge status: Home / Self Care | Payer: 59 | Source: Ambulatory Visit | Attending: Hematology | Admitting: Hematology

## 2021-01-02 ENCOUNTER — Other Ambulatory Visit: Payer: Self-pay

## 2021-01-02 DIAGNOSIS — Z1231 Encounter for screening mammogram for malignant neoplasm of breast: Secondary | ICD-10-CM

## 2021-02-03 ENCOUNTER — Other Ambulatory Visit: Payer: Self-pay

## 2021-02-03 DIAGNOSIS — Z170 Estrogen receptor positive status [ER+]: Secondary | ICD-10-CM

## 2021-02-03 MED ORDER — VENLAFAXINE HCL ER 150 MG PO CP24: ORAL_CAPSULE | ORAL | 6 refills | Status: DC

## 2021-05-29 IMAGING — MG DIGITAL SCREENING BILAT W/ CAD
5 series · 5 of 5 positions shown · non-contrast
Comparison: Previous exam(s).

CLINICAL DATA: Screening.

EXAM:
DIGITAL SCREENING BILATERAL MAMMOGRAM WITH CAD
TECHNIQUE: Bilateral screening digital craniocaudal and mediolateral oblique
mammograms were obtained. The images were evaluated with
computer-aided detection.

[L XCCL]
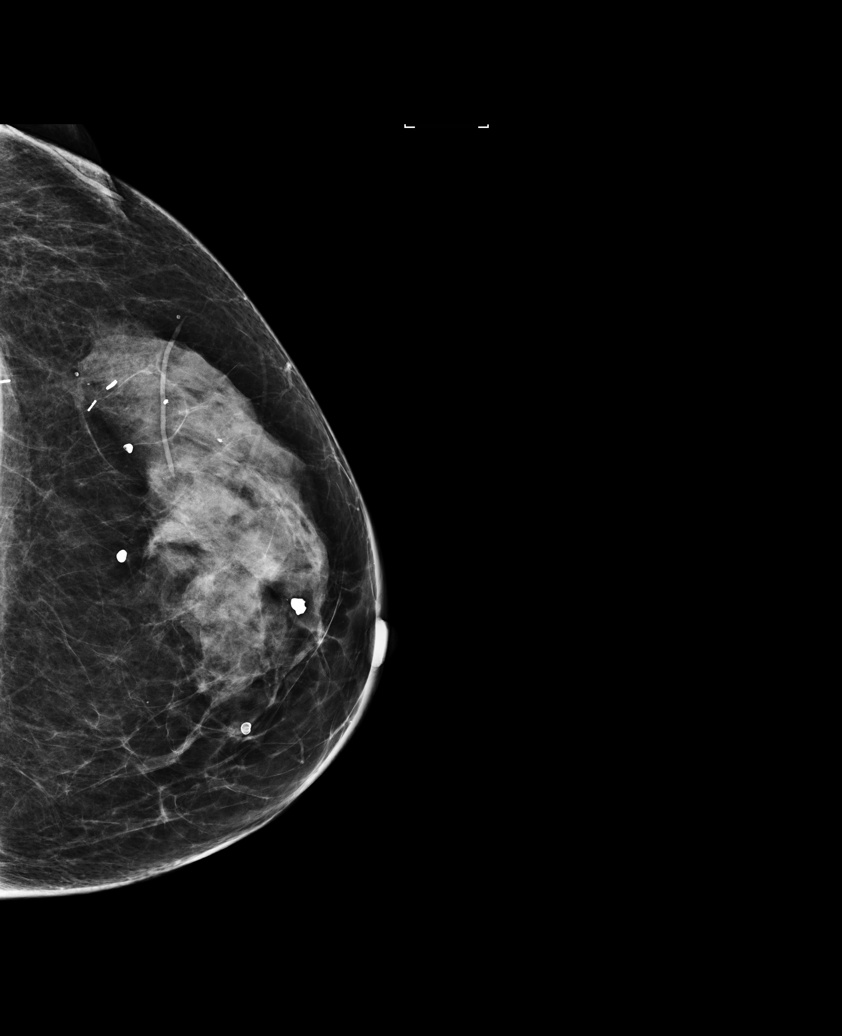

[R CC]
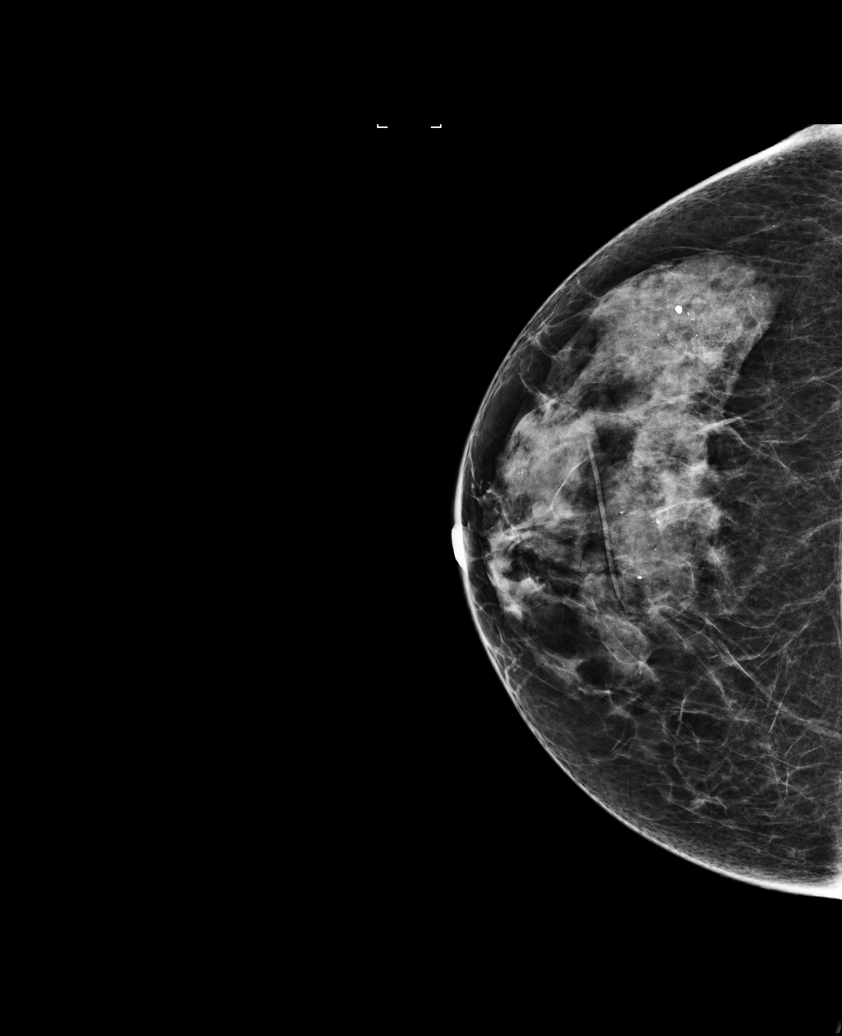

[R MLO]
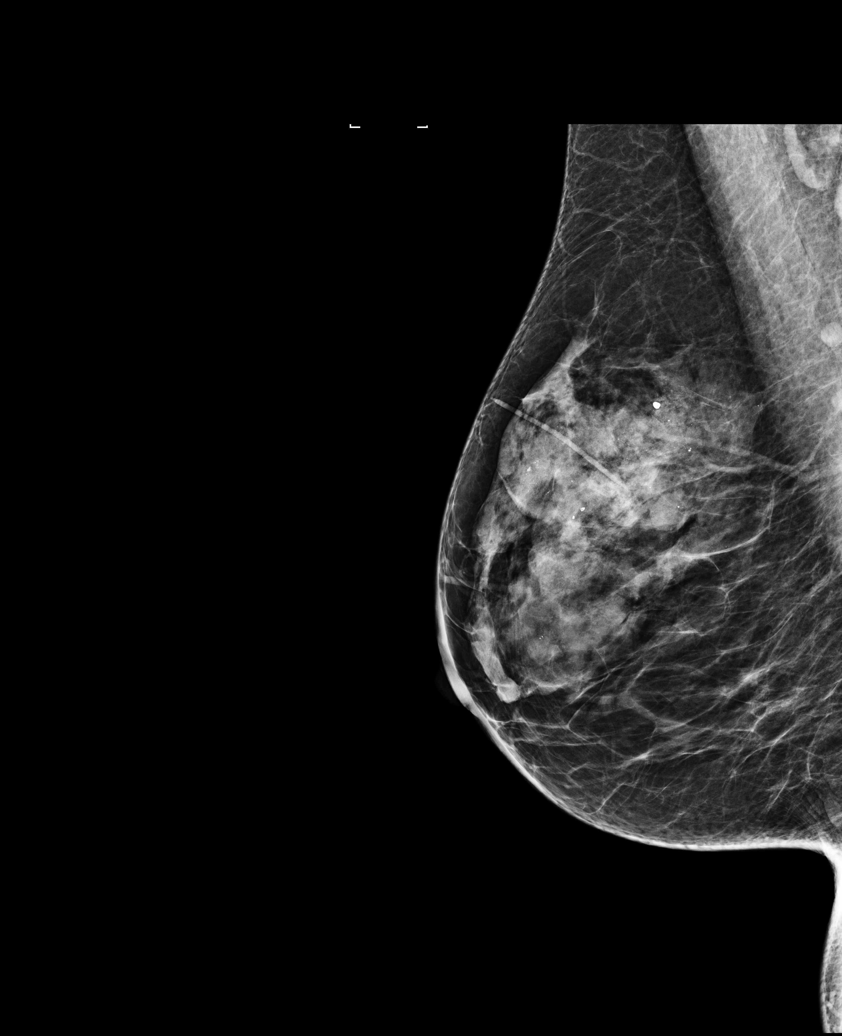

[L MLO]
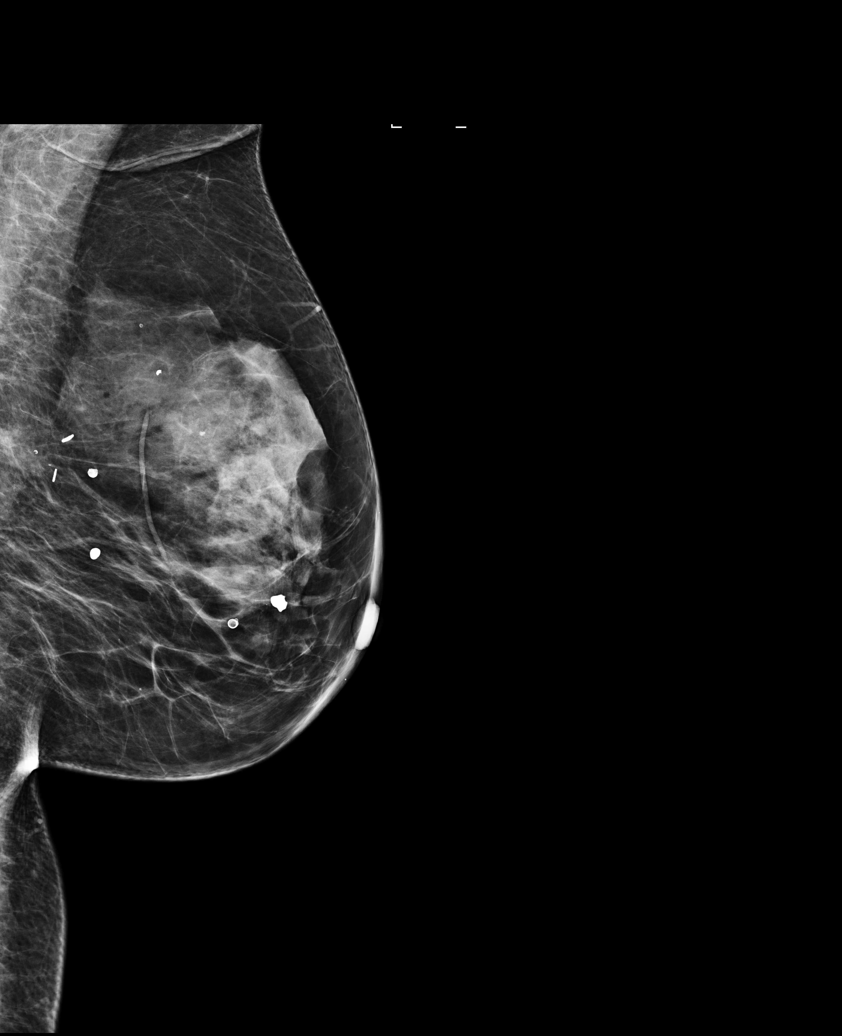

[L CC]
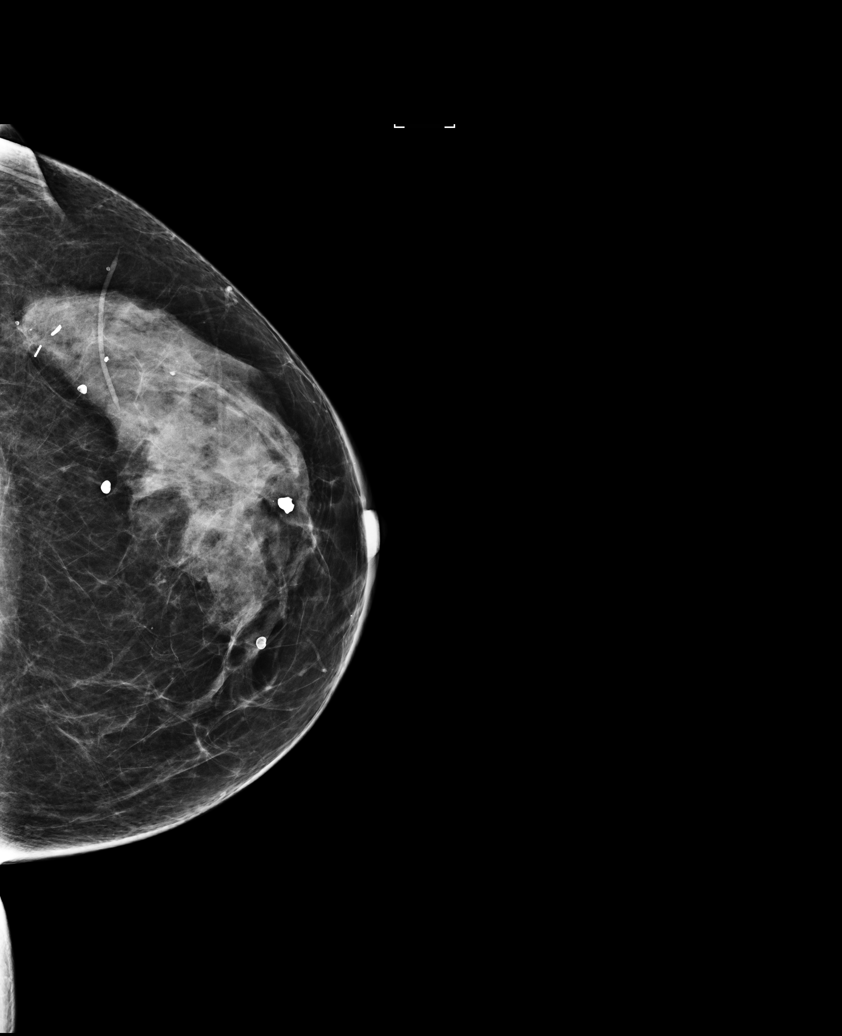

[5 of 5 positions shown; findings below may reference images not displayed]

ACR Breast Density Category d: The breast tissue is extremely dense,
which lowers the sensitivity of mammography
FINDINGS: There are no findings suspicious for malignancy. The images were
evaluated with computer-aided detection.
IMPRESSION: No mammographic evidence of malignancy. A result letter of this
screening mammogram will be mailed directly to the patient.

RECOMMENDATION:
Screening mammogram in one year. (Code:4I-A-YUM)

BI-RADS CATEGORY  1: Negative.

## 2021-06-03 LAB — LAB REPORT - SCANNED
A1c: 5.8
EGFR: 82

## 2021-09-17 ENCOUNTER — Other Ambulatory Visit: Payer: Self-pay | Admitting: Hematology

## 2021-09-17 DIAGNOSIS — C50112 Malignant neoplasm of central portion of left female breast: Secondary | ICD-10-CM

## 2021-10-31 ENCOUNTER — Other Ambulatory Visit: Payer: Self-pay | Admitting: Hematology

## 2021-10-31 DIAGNOSIS — C50112 Malignant neoplasm of central portion of left female breast: Secondary | ICD-10-CM

## 2021-12-05 ENCOUNTER — Other Ambulatory Visit: Payer: Self-pay | Admitting: Hematology

## 2021-12-05 DIAGNOSIS — Z170 Estrogen receptor positive status [ER+]: Secondary | ICD-10-CM

## 2021-12-05 DIAGNOSIS — C50112 Malignant neoplasm of central portion of left female breast: Secondary | ICD-10-CM

## 2021-12-07 NOTE — Progress Notes (Signed)
[place]   [phone] Fax:([phone]) [phone]   Clinic Follow up Note   Patient Care Team: [name], FNP as PCP - General (Family Medicine) 12/09/2021  I connected with Jodi Hoover on 12/09/21 at 10:30 AM EST by telephone visit and verified that I am speaking with the correct person using two identifiers.   I discussed the limitations, risks, security and privacy concerns of performing an evaluation and management service by telemedicine and the availability of in-person appointments. I also discussed with the patient that there may be a patient responsible charge related to this service. The patient expressed understanding and agreed to proceed.   Other persons participating in the visit and their role in the encounter: Home   Patients location: Home Providers location: [place] office   CHIEF COMPLAINT: Follow up left breast cancer   SUMMARY OF ONCOLOGIC HISTORY: Oncology History Overview Note  Cancer Staging Cancer of central portion of left female breast [facility]) Staging form: Breast, AJCC 8th Edition - Pathologic stage from 08/22/2014: Stage IA (pT1b, pN1b, cM0, G1, ER: Positive, PR: Positive, HER2: Negative) - Signed by Truitt Merle, MD on 01/12/2017 - Clinical: No stage assigned - Unsigned     Cancer of central portion of left female breast ([name])  06/11/2014 Mammogram   Bilateral diagnostic mammogram on 06/11/2014 showed a obscured spiculated 2.4 cm mass in the 3:00 position of the left breast. Ultrasound showed a 1.4 x 1.0 x 1.3 cm irregular hypoechoic mass with shadowing in the left breast at the 3:00 position 6 cm from the nipple. Sonographic evaluation the left axilla showed 2 lymph nodes with cortical thickening. The larger lymph node measured 0.8 cm.   06/13/2014 Initial Diagnosis   Cancer of central portion of left female breast ([name])   06/13/2014 Initial Biopsy   Left breast 3:00 biopsy showed invasive tubular carcinoma, grade 1, low-grade  DCIS, left axillary lymph node biopsy negative for malignancy.   06/13/2014 Receptors her2   ER and 96% are strongly positive, PR 12% strong positive, HER-2 not amplified, Ki67 4%.   08/22/2014 Surgery   Left breast lumpectomy and sentinel lymph node biopsy    08/22/2014 Pathology Results   Left breast lumpectomy showed invasive tubular carcinoma, 0.8 cm, grade 1, margins were negative, DCIS, intermediate grade. 2 out of 7 sentinel lymph nodes had metastatic carcinoma, tumor deposit measuring 2-5 mm.     - 11/26/2014 Adjuvant Chemotherapy   On 11/26/2014, the patient completed her 4th cycle of Taxotere and Cytoxan chemotherapy.    12/28/2014 - 02/11/2015 Radiation Therapy   From 12/28/14 - 02/11/15, the patient completed radiation with 50.4 Gy in 28 fractions with a boost of 10 Gy in 5 fractions for a total dose of 60.4 Gy in 33 fractions to the right breast and axilla.   02/25/2015 - 09/18/2015 Anti-estrogen oral therapy   From 02/25/15 - 09/18/15, the patient was on AI therapy with Letrozole by Dr. Sheran Lawless. Discontinued due to intolerable joint pain.   09/18/2015 - 02/24/2016 Anti-estrogen oral therapy   On 09/18/15, the patient was switched to Exemestane due to intolerable joint pain on Letrozole.   02/24/2016 - 03/25/2016 Anti-estrogen oral therapy   Exemestane changed to Tamoxifen due to tolerance issues. Tamoxifen discontinued after 1 month due to continuing joint pain, fatigue, and mood changes.   03/25/2016 -  Anti-estrogen oral therapy   Tamoxifen discontinued due to continuing joint pain, fatigue, and mood changes. The patient was changed back to Exemestane.   02/10/2018 Mammogram  02/10/2018 Mammogram IMPRESSION: No mammographic evidence of malignancy in either breast, status post left lumpectomy.     CURRENT THERAPY: Exemestane 25 mg once daily starting 02/2015, plan 7-10 years   INTERVAL HISTORY: Jodi Hoover presents for phone f/up as scheduled. Last seen in person by Dr.  Burr Medico 12/06/20. Mammogram 01/02/21 was negative. She was not aware of today's appointment. Her aunt recently died quickly from stage IV lung cancer.  [name] is still smoking less than 1 pack/day, she has "tried everything to quit," but still interested.  She has gained weight recently and feels very tired, she continues iron at night.  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  She is following up with her GP tomorrow for fatigue.  She continues to have normal bone pain, hot flashes and night sweats from exemestane, also has new sciatica and Celebrex is helping.  Effexor helps her symptoms.  Denies new breast concerns such as lumps/mass, nipple discharge or inversion, or skin change.  She has a "sugar addiction" since breast cancer but was able to come off metformin. Denies change in bowel habits, bleeding, new bone pain, or any other new specific complaints.   MEDICAL HISTORY:  Past Medical History:  Diagnosis Date   Atrial tachycardia, paroxysmal (HCC)    Depression    Heart murmur    Hypertension    Personal history of chemotherapy    Personal history of radiation therapy    PVC's (premature ventricular contractions)     SURGICAL HISTORY: Past Surgical History:  Procedure Laterality Date   BREAST EXCISIONAL BIOPSY Right    BREAST LUMPECTOMY Left 08/2014   left breast lumpectomy  08/2014    I have reviewed the social history and family history with the patient and they are unchanged from previous note.  ALLERGIES:  is allergic to other.  MEDICATIONS:  Current Outpatient Medications  Medication Sig Dispense Refill   Biotin 10000 MCG TABS Take by mouth.     Calcium-Magnesium-Vitamin D (CALCIUM 500 PO) Take 1 tablet by mouth daily.     gabapentin (NEURONTIN) 600 MG tablet Take 1 tablet (600 mg total) by mouth 3 (three) times daily. 90 tablet 3   lisinopril (PRINIVIL,ZESTRIL) 5 MG tablet Take 5 mg by mouth daily.     Methylsulfonylmethane (MSM) 1000  MG CAPS Take 2,000 mg by mouth daily.     atorvastatin (LIPITOR) 20 MG tablet Take 20 mg by mouth daily.     celecoxib (CELEBREX) 200 MG capsule Take 200 mg by mouth daily.     COLLAGEN PO Take 1,000 mg by mouth.     exemestane (AROMASIN) 25 MG tablet TAKE ONE TABLET BY MOUTH IN THE MORNING 90 tablet 3   ferrous sulfate 325 (65 FE) MG EC tablet Take 1 tablet (325 mg total) by mouth 3 (three) times daily with meals. 90 tablet 1   topiramate (TOPAMAX) 50 MG tablet Take 50 mg by mouth 2 (two) times daily.     traMADol (ULTRAM) 50 MG tablet Take 1 tablet (50 mg total) by mouth as needed. 20 tablet 0   venlafaxine XR (EFFEXOR-XR) 150 MG 24 hr capsule Take 1 capsule (150 mg total) by mouth every morning. 90 capsule 0   No current facility-administered medications for this visit.    PHYSICAL EXAMINATION: ECOG PERFORMANCE STATUS: 1 - Symptomatic but completely ambulatory  There were no vitals filed for this visit. There were no vitals filed for this visit.  Patient appears  well over the phone.  Voice is strong, speech is clear.  Mood/affect appear normal.  No cough or conversational dyspnea   LABORATORY DATA:  I have reviewed the data as listed CBC Latest Ref Rng & Units 12/06/2020 09/07/2019 09/07/2018  WBC 4.0 - 10.5 K/uL 7.7 7.1 7.8  Hemoglobin 12.0 - 15.0 g/dL 11.8(L) 11.8(L) 11.5(L)  Hematocrit 36.0 - 46.0 % 36.8 36.3 35.9(L)  Platelets 150 - 400 K/uL 264 231 245     CMP Latest Ref Rng & Units 12/06/2020 09/07/2019 09/07/2018  Glucose 70 - 99 mg/dL 86 94 91  BUN 6 - 20 mg/dL _0 Creatinine 0.44 - 1.00 mg/dL 0.93 0.86 0.84  Sodium 135 - 145 mmol/L 138 137 143  Potassium 3.5 - 5.1 mmol/L 4.3 4.6 3.9  Chloride 98 - 111 mmol/L 110 103 110  CO2 22 - 32 mmol/L _1 Calcium 8.9 - 10.3 mg/dL 9.3 9.4 9.3  Total Protein 6.5 - 8.1 g/dL 7.2 7.0 6.9  Total Bilirubin 0.3 - 1.2 mg/dL 0.5 0.8 0.3  Alkaline Phos 38 - 126 U/L 78 76 66  AST 15 - 41 U/L 32 22 14(L)  ALT 0 - 44 U/L _2 RADIOGRAPHIC STUDIES: I have personally reviewed the radiological images as listed and agreed with the findings in the report. No results found.   ASSESSMENT & PLAN: Jodi Hoover is a 61 y.o. female with    1. 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 S/p left lumpectomy, adjuvant chemo TC, adjuvant Radiation therapy. She has tried several AI since starting in 02/2015. She is currently on Exemestane tolerating well with manageable hot flashes and joint pain on Effexor and gabapentin. Plan to complete 7-10 years in total.  -mammogram 12/2020 was negative, she does have extremely dense breast tissue category d. We discussed she may benefit from screening breast MRI in the future.  -Ms. Zufall appears to be doing well, breast exam deferred on virtual visit. She is tolerating exemestane. Recent labs she stable mild anemia, otherwise unremarkable. -she will continue exemestane and surveillance. Next mammogram 12/2021, order placed.  -she will f/up in person in 1 year, or sooner if needed   2. Smoking Cessation  -She has been smoking on/off for 40 years, nearly 1 PPD  -She has tried many ways to quit, previously declined [facility] smoking cessation clinic but she is interested now. I referred her -she meets criteria for low dose lung cancer screening CT, and is very interested given her aunt just died from stage IV lung cancer. I placed the order and will f/up with results    3. Hyperglycemia, HTN -She changed PCP due to new insurance -able to come off metformin this year   4. Osteopenia  -08/31/19 DEXA shows osteopenia with lowest T-score -1.8 at AP spine -continues calcium and vitamin D and weight bearing exercise -repeat DEXA 12/2021 with mammogram, order placed     5. Mild anemia  -She has had mild anemia since 09/2018. -Last colonoscopy was over 4 year ago and  had no polyps, normal.   -She is currently taking prenatal vitamin and oral iron po qHS. I refilled  -she has increased fatigue lately, anemia is stable. She will see PCP tomorrow, will defer further testing to them. She will request they send [place] her labs/record.  PLAN: -Labs  reviewed -Proceed with PCP f/up for fatigue, consider iron B12, TSH -Refill ferrous sulfate, exemestane, venlafaxine -Mammo/DEXA 12/2021 -Per pt UTD age appropriate vaccines, boosters, and other cancer screenings  -Lung cancer screening CT, referral to [place] smoking cessation -In person f/up in 1 year, or sooner if needed   Orders Placed This Encounter  Procedures   CT CHEST LUNG CA SCREEN LOW DOSE W/O CM    Standing Status:   Future    Standing Expiration Date:   12/09/2022    Order Specific Question:   Preferred Imaging Location?    Answer:   [facility]   MM Digital Screening    Standing Status:   Future    Standing Expiration Date:   12/09/2022    Order Specific Question:   Reason for Exam (SYMPTOM  OR DIAGNOSIS REQUIRED)    Answer:   h/o left breast cancer 2015    Order Specific Question:   Preferred imaging location?    Answer:   [facility]   DG Bone Density    Standing Status:   Future    Standing Expiration Date:   12/09/2022    Order Specific Question:   Reason for Exam (SYMPTOM  OR DIAGNOSIS REQUIRED)    Answer:   osteopenia 2020, on AI    Order Specific Question:   Preferred imaging location?    Answer:   [facility]   Ambulatory referral to Smoking Cessation Program    Referral Priority:   Routine    Referral Type:   Consultation    Referral Reason:   Specialty Services Required    Number of Visits Requested:   1   I discussed the assessment and treatment plan with the patient. The patient was provided an opportunity to ask questions and all were answered. The patient agreed with the plan and demonstrated an understanding of the instructions.   The patient was advised to  call back or seek an in-person evaluation if the symptoms worsen or if the condition fails to improve as anticipated. I spent 20 minutes non face to face time on today's call.      Alla Feeling, NP 12/09/21

## 2021-12-09 ENCOUNTER — Inpatient Hospital Stay (HOSPITAL_BASED_OUTPATIENT_CLINIC_OR_DEPARTMENT_OTHER): Payer: 59 | Admitting: Nurse Practitioner

## 2021-12-09 ENCOUNTER — Ambulatory Visit: Payer: 59 | Admitting: Nurse Practitioner

## 2021-12-09 ENCOUNTER — Encounter: Payer: Self-pay | Admitting: Nurse Practitioner

## 2021-12-09 ENCOUNTER — Other Ambulatory Visit: Payer: 59

## 2021-12-09 ENCOUNTER — Inpatient Hospital Stay: Payer: 59 | Attending: Nurse Practitioner

## 2021-12-09 DIAGNOSIS — E2839 Other primary ovarian failure: Secondary | ICD-10-CM

## 2021-12-09 DIAGNOSIS — Z923 Personal history of irradiation: Secondary | ICD-10-CM

## 2021-12-09 DIAGNOSIS — D649 Anemia, unspecified: Secondary | ICD-10-CM

## 2021-12-09 DIAGNOSIS — F17210 Nicotine dependence, cigarettes, uncomplicated: Secondary | ICD-10-CM

## 2021-12-09 DIAGNOSIS — Z170 Estrogen receptor positive status [ER+]: Secondary | ICD-10-CM

## 2021-12-09 DIAGNOSIS — C50112 Malignant neoplasm of central portion of left female breast: Secondary | ICD-10-CM

## 2021-12-09 DIAGNOSIS — R739 Hyperglycemia, unspecified: Secondary | ICD-10-CM

## 2021-12-09 DIAGNOSIS — Z9221 Personal history of antineoplastic chemotherapy: Secondary | ICD-10-CM

## 2021-12-09 DIAGNOSIS — I10 Essential (primary) hypertension: Secondary | ICD-10-CM

## 2021-12-09 DIAGNOSIS — Z122 Encounter for screening for malignant neoplasm of respiratory organs: Secondary | ICD-10-CM

## 2021-12-09 DIAGNOSIS — Z79811 Long term (current) use of aromatase inhibitors: Secondary | ICD-10-CM

## 2021-12-09 DIAGNOSIS — Z79899 Other long term (current) drug therapy: Secondary | ICD-10-CM

## 2021-12-09 DIAGNOSIS — M8580 Other specified disorders of bone density and structure, unspecified site: Secondary | ICD-10-CM

## 2021-12-09 MED ORDER — EXEMESTANE 25 MG PO TABS: ORAL_TABLET | ORAL | 3 refills | Status: DC

## 2021-12-09 MED ORDER — VENLAFAXINE HCL ER 150 MG PO CP24: 150.0000 mg | ORAL_CAPSULE | Freq: Every morning | ORAL | 0 refills | Status: DC

## 2021-12-09 MED ORDER — FERROUS SULFATE 325 (65 FE) MG PO TBEC: 325.0000 mg | DELAYED_RELEASE_TABLET | Freq: Three times a day (TID) | ORAL | 1 refills | Status: DC

## 2021-12-10 ENCOUNTER — Telehealth: Payer: Self-pay | Admitting: Nurse Practitioner

## 2021-12-10 NOTE — Telephone Encounter (Signed)
Scheduled follow-up appointment per 2/7 los. Patient is aware.

## 2021-12-11 LAB — LAB REPORT - SCANNED
A1c: 5.8
EGFR: 88

## 2021-12-15 ENCOUNTER — Other Ambulatory Visit: Payer: Self-pay

## 2021-12-15 DIAGNOSIS — Z122 Encounter for screening for malignant neoplasm of respiratory organs: Secondary | ICD-10-CM

## 2022-04-03 ENCOUNTER — Other Ambulatory Visit: Payer: Self-pay | Admitting: Nurse Practitioner

## 2022-04-03 DIAGNOSIS — Z170 Estrogen receptor positive status [ER+]: Secondary | ICD-10-CM

## 2022-06-30 ENCOUNTER — Other Ambulatory Visit: Payer: Self-pay | Admitting: Nurse Practitioner

## 2022-06-30 DIAGNOSIS — C50112 Malignant neoplasm of central portion of left female breast: Secondary | ICD-10-CM

## 2022-10-06 ENCOUNTER — Ambulatory Visit
Admission: RE | Admit: 2022-10-06 | Discharge: 2022-10-06 | Disposition: A | Discharge status: Home / Self Care | Payer: Self-pay | Source: Ambulatory Visit | Attending: Nurse Practitioner | Admitting: Nurse Practitioner

## 2022-10-06 ENCOUNTER — Other Ambulatory Visit: Payer: Self-pay

## 2022-10-06 DIAGNOSIS — C50112 Malignant neoplasm of central portion of left female breast: Secondary | ICD-10-CM

## 2022-10-06 MED ORDER — EXEMESTANE 25 MG PO TABS: ORAL_TABLET | ORAL | 3 refills | Status: AC

## 2022-10-06 MED ORDER — VENLAFAXINE HCL ER 150 MG PO CP24: 150.0000 mg | ORAL_CAPSULE | Freq: Every morning | ORAL | 0 refills | Status: DC

## 2022-10-19 ENCOUNTER — Ambulatory Visit (INDEPENDENT_AMBULATORY_CARE_PROVIDER_SITE_OTHER): Payer: 59 | Admitting: Internal Medicine

## 2022-10-19 ENCOUNTER — Encounter: Payer: Self-pay | Admitting: Internal Medicine

## 2022-10-19 VITALS — BP 118/76 | HR 70 | Ht 62.0 in | Wt 153.0 lb

## 2022-10-19 DIAGNOSIS — Z0001 Encounter for general adult medical examination with abnormal findings: Secondary | ICD-10-CM

## 2022-10-19 DIAGNOSIS — R5383 Other fatigue: Secondary | ICD-10-CM | POA: Diagnosis not present

## 2022-10-19 DIAGNOSIS — Z23 Encounter for immunization: Secondary | ICD-10-CM | POA: Diagnosis not present

## 2022-10-19 DIAGNOSIS — I10 Essential (primary) hypertension: Secondary | ICD-10-CM | POA: Diagnosis not present

## 2022-10-19 NOTE — Patient Instructions (Addendum)
Thank you for trusting me with your care. To recap, today we discussed the following:  You came to establish care today and have concern for chronic fatigue since undergoing cancer treatment. We will have you sign a form so we can get your medical records from previous PCP. I have placed orders to check routine labs and labs to evaluate fatigue. I will follow up with results and recommendations.   Stop Gabapentin. This may help with fatigue. If you notice your pain is not controlled then we can restart medication.  Follow up for Pap Smear.   TDAP today    - CMP14+EGFR - Hemoglobin A1C - Lipid panel - HIV Antibody (routine testing w rflx) - CBC with Differential/Platelet - Hepatitis C Antibody - TSH - T4, free

## 2022-10-19 NOTE — Progress Notes (Unsigned)
     CC: Establish Care, Hypothyroidism, and Back Pain     HPI:Ms.Jodi Hoover is a 61 y.o. female who presents for evaluation of ***. For the details of today's visit, please refer to the assessment and plan.   *** Assessment/Plan: ***  *** Assessment/Plan: ***  *** Assessment/Plan: ***    Depression, PHQ-9: Based on the patients  [place] Visit from 10/19/2022 in [facility]  PHQ-9 Total Score 7      score we have ***.  Past Medical History:  Diagnosis Date   Atrial tachycardia, paroxysmal    Depression    Heart murmur    Hypertension    Personal history of chemotherapy    Personal history of radiation therapy    PVC's (premature ventricular contractions)     Past Surgical History:  Procedure Laterality Date   BREAST EXCISIONAL BIOPSY Right    BREAST LUMPECTOMY Left 08/2014   left breast lumpectomy  08/2014    Family History  Problem Relation Age of Onset   Hypertension Mother    Cancer Paternal Aunt        lung cancer    Social History   Tobacco Use   Smoking status: Every Day    Packs/day: 1.00    Years: 16.00    Total pack years: 16.00    Types: Cigarettes   Smokeless tobacco: Never  Substance Use Topics   Alcohol use: Yes    Comment: ocassionally wine   Drug use: No    Review of Systems:    ROS   Physical Exam: Vitals:   10/19/22 1508 10/19/22 1511  BP: (!) 158/80 118/76  Pulse: 70   SpO2: 94%   Weight: 153 lb (69.4 kg)   Height: '5\' 2"'$  (1.575 m)      Physical Exam   Assessment & Plan:   No problem-specific Assessment & Plan notes found for this encounter.    Lorene Dy, MD

## 2022-10-21 DIAGNOSIS — Z124 Encounter for screening for malignant neoplasm of cervix: Secondary | ICD-10-CM | POA: Insufficient documentation

## 2022-10-21 DIAGNOSIS — Z1211 Encounter for screening for malignant neoplasm of colon: Secondary | ICD-10-CM | POA: Insufficient documentation

## 2022-10-21 DIAGNOSIS — R5383 Other fatigue: Secondary | ICD-10-CM | POA: Insufficient documentation

## 2022-10-21 DIAGNOSIS — Z0001 Encounter for general adult medical examination with abnormal findings: Secondary | ICD-10-CM | POA: Insufficient documentation

## 2022-10-21 NOTE — Assessment & Plan Note (Addendum)
Patient's BP today is 118/76 with a goal of <140/80. The patient endorses adherence to benazepril. No lightheadedness, weakness,  dizziness on standing, or swelling in the feet or ankles.   Assessment/Plan: Chronic problem, controlled - Check BMP today  - Continue Benazepril 20 mg daily

## 2022-10-21 NOTE — Assessment & Plan Note (Addendum)
Fatigue started with cancer treatment. 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 She was told she had hypothyroidism at one time but not on thyroid hormone replacement. She mentions being treated with methimazole. Patient is going to sign record request form today and I explained it sounds like she was treated for hyperthyroidism.  - CMP14+EGFR - Hemoglobin A1C - TSH - T4, free - CBC with Differential/Platelet - Follow up in 2 weeks

## 2022-10-21 NOTE — Assessment & Plan Note (Signed)
-  CMP14+EGFR - Hemoglobin A1C - Lipid panel - HIV Antibody (routine testing w rflx) - CBC with Differential/Platelet - Hepatitis C Antibody

## 2022-10-22 DIAGNOSIS — Z0001 Encounter for general adult medical examination with abnormal findings: Secondary | ICD-10-CM | POA: Diagnosis not present

## 2022-10-22 DIAGNOSIS — R5383 Other fatigue: Secondary | ICD-10-CM | POA: Diagnosis not present

## 2022-10-23 LAB — CBC WITH DIFFERENTIAL/PLATELET
Basophils Absolute: 0.1 10*3/uL (ref 0.0–0.2)
Basos: 1 %
EOS (ABSOLUTE): 0.4 10*3/uL (ref 0.0–0.4)
Eos: 4 %
Hematocrit: 37.4 % (ref 34.0–46.6)
Hemoglobin: 12.2 g/dL (ref 11.1–15.9)
Immature Grans (Abs): 0 10*3/uL (ref 0.0–0.1)
Immature Granulocytes: 0 %
Lymphocytes Absolute: 2.9 10*3/uL (ref 0.7–3.1)
Lymphs: 34 %
MCH: 30.9 pg (ref 26.6–33.0)
MCHC: 32.6 g/dL (ref 31.5–35.7)
MCV: 95 fL (ref 79–97)
Monocytes Absolute: 0.6 10*3/uL (ref 0.1–0.9)
Monocytes: 8 %
Neutrophils Absolute: 4.5 10*3/uL (ref 1.4–7.0)
Neutrophils: 53 %
Platelets: 287 10*3/uL (ref 150–450)
RBC: 3.95 x10E6/uL (ref 3.77–5.28)
RDW: 11.7 % (ref 11.7–15.4)
WBC: 8.4 10*3/uL (ref 3.4–10.8)

## 2022-10-23 LAB — LIPID PANEL
Chol/HDL Ratio: 2.5 ratio (ref 0.0–4.4)
Cholesterol, Total: 164 mg/dL (ref 100–199)
HDL: 65 mg/dL (ref >39)
LDL Chol Calc (NIH): 89 mg/dL (ref 0–99)
Triglycerides: 46 mg/dL (ref 0–149)
VLDL Cholesterol Cal: 10 mg/dL (ref 5–40)

## 2022-10-23 LAB — TSH: TSH: 6.23 u[IU]/mL — ABNORMAL HIGH (ref 0.450–4.500)

## 2022-10-23 LAB — HIV ANTIBODY (ROUTINE TESTING W REFLEX): HIV Screen 4th Generation wRfx: NONREACTIVE

## 2022-10-23 LAB — HEMOGLOBIN A1C
Est. average glucose Bld gHb Est-mCnc: 123 mg/dL
Hgb A1c MFr Bld: 5.9 % — ABNORMAL HIGH (ref 4.8–5.6)

## 2022-10-23 LAB — T4, FREE: Free T4: 0.86 ng/dL (ref 0.82–1.77)

## 2022-10-23 LAB — HEPATITIS C ANTIBODY: Hep C Virus Ab: NONREACTIVE

## 2022-10-27 ENCOUNTER — Other Ambulatory Visit: Payer: Self-pay | Admitting: Internal Medicine

## 2022-10-27 DIAGNOSIS — E0500 Thyrotoxicosis with diffuse goiter without thyrotoxic crisis or storm: Secondary | ICD-10-CM

## 2022-10-27 MED ORDER — METHIMAZOLE 5 MG PO TABS: 5.0000 mg | ORAL_TABLET | Freq: Every day | ORAL | 0 refills | Status: DC

## 2022-10-27 NOTE — Progress Notes (Signed)
Reviewed labs from recent visit with patient. She is taking only 10 mg once a day of methimazole for Graves disease. TSH elevated and patient has fatigue. She is decreasing to 5 mg once daily and I recommended having thyroid function test repeated in 6 to 8 weeks.

## 2022-11-04 ENCOUNTER — Encounter: Payer: Self-pay | Admitting: Family Medicine

## 2022-11-04 ENCOUNTER — Other Ambulatory Visit (HOSPITAL_COMMUNITY)
Admission: RE | Admit: 2022-11-04 | Discharge: 2022-11-04 | Disposition: A | Discharge status: Home / Self Care | Payer: Commercial Managed Care - HMO | Source: Ambulatory Visit | Attending: Family Medicine | Admitting: Family Medicine

## 2022-11-04 ENCOUNTER — Ambulatory Visit (INDEPENDENT_AMBULATORY_CARE_PROVIDER_SITE_OTHER): Payer: Commercial Managed Care - HMO | Admitting: Family Medicine

## 2022-11-04 VITALS — BP 138/82 | HR 64 | Ht 62.0 in | Wt 150.0 lb

## 2022-11-04 DIAGNOSIS — Z1151 Encounter for screening for human papillomavirus (HPV): Secondary | ICD-10-CM | POA: Diagnosis present

## 2022-11-04 DIAGNOSIS — Z124 Encounter for screening for malignant neoplasm of cervix: Secondary | ICD-10-CM | POA: Insufficient documentation

## 2022-11-04 NOTE — Assessment & Plan Note (Addendum)
Pap smear done. Patient tolerated the procedure well.

## 2022-11-04 NOTE — Patient Instructions (Signed)
We will Contact you with the Pap smear results.

## 2022-11-04 NOTE — Progress Notes (Signed)
   Established Patient Office Visit  Subjective   Patient ID: Jodi Hoover, female    DOB: 04-Feb-1961  Age: 62 y.o. MRN: [id number]  Chief Complaint  Patient presents with   Fatigue    Patient complains of continued fatigue after methimazole dosage was lowered.     Jodi Hoover is a 62 year old female with history of left breast cancer s/p left lumpectomy, tobacco use disorder,osteopenia and HTN,  presents to the clinic for Pap smear for cervical cancer screening.     Patient Active Problem List   Diagnosis Date Noted   Fatigue 10/21/2022   Encounter for Papanicolaou smear for cervical cancer screening 10/21/2022   Cancer of central portion of left female breast ([place]) 01/12/2017   Essential hypertension, benign 10/04/2013   PVC's (premature ventricular contractions) 10/04/2013   Atrial tachycardia, paroxysmal 10/04/2013      Review of Systems  Genitourinary:  Negative for dysuria, flank pain, frequency, hematuria and urgency.      Objective:     BP 138/82   Pulse 64   Ht '5\' 2"'$  (1.575 m)   Wt 150 lb (68 kg)   SpO2 95%   BMI 27.44 kg/m  BP Readings from Last 3 Encounters:  11/04/22 138/82  10/19/22 118/76  12/06/20 135/83      Physical Exam Cardiovascular:     Pulses: Normal pulses.  Pulmonary:     Effort: Pulmonary effort is normal.  Genitourinary:    General: Normal vulva.     Pubic Area: No rash or pubic lice.      Labia:        Right: No rash, tenderness, lesion or injury.        Left: No rash, tenderness, lesion or injury.      Urethra: No prolapse, urethral pain, urethral swelling or urethral lesion.  Skin:    General: Skin is warm and dry.  Psychiatric:        Mood and Affect: Mood normal.      No results found for any visits on 11/04/22.  Last CBC Lab Results  Component Value Date   WBC 8.4 10/22/2022   HGB 12.2 10/22/2022   HCT 37.4 10/22/2022   MCV 95 10/22/2022   MCH 30.9 10/22/2022   RDW 11.7 10/22/2022   PLT 287 10/22/2022       The 10-year ASCVD risk score (Arnett DK, et al., 2019) is: 9.9%    Assessment & Plan:   Problem List Items Addressed This Visit       Other   Encounter for Papanicolaou smear for cervical cancer screening - Primary    Pap smear done. Patient tolerated the procedure well.      Relevant Orders   Cytology - PAP    No follow-ups on file.    Renard Hamper Ria Comment, FNP

## 2022-11-10 LAB — CYTOLOGY - PAP
Adequacy: ABSENT
Diagnosis: NEGATIVE

## 2022-11-16 NOTE — Progress Notes (Signed)
Please let patient know pap smear results are negative.  Negative for intraepithelial lesion or malignancy (NILM)

## 2022-12-10 ENCOUNTER — Other Ambulatory Visit: Payer: Self-pay

## 2022-12-10 DIAGNOSIS — Z170 Estrogen receptor positive status [ER+]: Secondary | ICD-10-CM

## 2022-12-11 ENCOUNTER — Other Ambulatory Visit: Payer: Self-pay

## 2022-12-11 ENCOUNTER — Inpatient Hospital Stay: Payer: Commercial Managed Care - HMO

## 2022-12-11 ENCOUNTER — Encounter: Payer: Self-pay | Admitting: Hematology

## 2022-12-11 ENCOUNTER — Inpatient Hospital Stay: Payer: Commercial Managed Care - HMO | Attending: Hematology | Admitting: Hematology

## 2022-12-11 VITALS — BP 162/91 | HR 63 | Temp 98.4°F | Resp 13 | Wt 149.5 lb

## 2022-12-11 DIAGNOSIS — Z9221 Personal history of antineoplastic chemotherapy: Secondary | ICD-10-CM | POA: Insufficient documentation

## 2022-12-11 DIAGNOSIS — Z79899 Other long term (current) drug therapy: Secondary | ICD-10-CM | POA: Insufficient documentation

## 2022-12-11 DIAGNOSIS — R5383 Other fatigue: Secondary | ICD-10-CM | POA: Diagnosis not present

## 2022-12-11 DIAGNOSIS — Z923 Personal history of irradiation: Secondary | ICD-10-CM | POA: Diagnosis not present

## 2022-12-11 DIAGNOSIS — Z170 Estrogen receptor positive status [ER+]: Secondary | ICD-10-CM

## 2022-12-11 DIAGNOSIS — Z1231 Encounter for screening mammogram for malignant neoplasm of breast: Secondary | ICD-10-CM

## 2022-12-11 DIAGNOSIS — M2550 Pain in unspecified joint: Secondary | ICD-10-CM | POA: Insufficient documentation

## 2022-12-11 DIAGNOSIS — Z79811 Long term (current) use of aromatase inhibitors: Secondary | ICD-10-CM | POA: Diagnosis not present

## 2022-12-11 DIAGNOSIS — C50112 Malignant neoplasm of central portion of left female breast: Secondary | ICD-10-CM | POA: Diagnosis present

## 2022-12-11 DIAGNOSIS — E2839 Other primary ovarian failure: Secondary | ICD-10-CM | POA: Diagnosis not present

## 2022-12-11 DIAGNOSIS — R232 Flushing: Secondary | ICD-10-CM | POA: Insufficient documentation

## 2022-12-11 LAB — CBC WITH DIFFERENTIAL (CANCER CENTER ONLY)
Abs Immature Granulocytes: 0.03 10*3/uL (ref 0.00–0.07)
Basophils Absolute: 0.1 10*3/uL (ref 0.0–0.1)
Basophils Relative: 1 %
Eosinophils Absolute: 0.4 10*3/uL (ref 0.0–0.5)
Eosinophils Relative: 4 %
HCT: 36.7 % (ref 36.0–46.0)
Hemoglobin: 12.6 g/dL (ref 12.0–15.0)
Immature Granulocytes: 0 %
Lymphocytes Relative: 37 %
Lymphs Abs: 3.6 10*3/uL (ref 0.7–4.0)
MCH: 31.7 pg (ref 26.0–34.0)
MCHC: 34.3 g/dL (ref 30.0–36.0)
MCV: 92.2 fL (ref 80.0–100.0)
Monocytes Absolute: 1 10*3/uL (ref 0.1–1.0)
Monocytes Relative: 11 %
Neutro Abs: 4.7 10*3/uL (ref 1.7–7.7)
Neutrophils Relative %: 47 %
Platelet Count: 278 10*3/uL (ref 150–400)
RBC: 3.98 MIL/uL (ref 3.87–5.11)
RDW: 12.6 % (ref 11.5–15.5)
WBC Count: 9.8 10*3/uL (ref 4.0–10.5)
nRBC: 0 % (ref 0.0–0.2)

## 2022-12-11 LAB — CMP (CANCER CENTER ONLY)
ALT: 10 U/L (ref 0–44)
AST: 15 U/L (ref 15–41)
Albumin: 4.2 g/dL (ref 3.5–5.0)
Alkaline Phosphatase: 71 U/L (ref 38–126)
Anion gap: 7 (ref 5–15)
BUN: 15 mg/dL (ref 8–23)
CO2: 24 mmol/L (ref 22–32)
Calcium: 9.6 mg/dL (ref 8.9–10.3)
Chloride: 108 mmol/L (ref 98–111)
Creatinine: 0.73 mg/dL (ref 0.44–1.00)
GFR, Estimated: >60 mL/min (ref >60)
Glucose, Bld: 92 mg/dL (ref 70–99)
Potassium: 3.8 mmol/L (ref 3.5–5.1)
Sodium: 139 mmol/L (ref 135–145)
Total Bilirubin: 0.4 mg/dL (ref 0.3–1.2)
Total Protein: 7.1 g/dL (ref 6.5–8.1)

## 2022-12-11 NOTE — Progress Notes (Signed)
[place]   [phone] [phone]   Clinic Follow up Note   Patient Care Team: Del Eli Hose, [place] as PCP - General (Family Medicine) Truitt Merle, MD as Attending Physician (Hematology and Oncology)  Date of Service:  12/11/2022  CHIEF COMPLAINT: f/u of left breast cancer    CURRENT THERAPY:  Exemestane 25 mg once daily starting 02/2015, plan 7-10 years    ASSESSMENT:  Jodi Hoover is a 62 y.o. 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 She is s/p left lumpectomy, adjuvant chemo TC, adjuvant Radiation therapy. She has tried several AI since starting in 02/2015. She is currently on Exemestane tolerating well with manageable hot flashes on Effexor. Plan to complete 7-10 years in total.  -She is clinically doing well. Lab reviewed. Her physical exam was unremarkable except known right breast lump. There is no clinical concern for recurrence.  -Continue surveillance. Next mammogram on 01/02/21 -Continue exemestane , she does have moderate muscular and joint discomfort, possible related to exemestane.  I recommend her to complete 8-year exemestane in April 2024, then stop.she agrees.  We discussed that study showed extended aromatase inhibitor 7 years is similar to the outcome of 10 years.   PLAN: -lab reviewed -Mammogram due 10/2023 -Bone Density Scan in 3 months -Continue exemestane until the end of April 2024, then stop. -Follow-up in 1 year.    SUMMARY OF ONCOLOGIC HISTORY: Oncology History Overview Note  Cancer Staging Cancer of central portion of left female breast [facility]) Staging form: Breast, AJCC 8th Edition - Pathologic stage from 08/22/2014: Stage IA (pT1b, pN1b, cM0, G1, ER: Positive, PR: Positive, HER2: Negative) - Signed by Truitt Merle, MD on 01/12/2017 - Clinical: No stage  assigned - Unsigned     Cancer of central portion of left female breast ([place])  06/11/2014 Mammogram   Bilateral diagnostic mammogram on 06/11/2014 showed a obscured spiculated 2.4 cm mass in the 3:00 position of the left breast. Ultrasound showed a 1.4 x 1.0 x 1.3 cm irregular hypoechoic mass with shadowing in the left breast at the 3:00 position 6 cm from the nipple. Sonographic evaluation the left axilla showed 2 lymph nodes with cortical thickening. The larger lymph node measured 0.8 cm.   06/13/2014 Initial Diagnosis   Cancer of central portion of left female breast ([place])   06/13/2014 Initial Biopsy   Left breast 3:00 biopsy showed invasive tubular carcinoma, grade 1, low-grade DCIS, left axillary lymph node biopsy negative for malignancy.   06/13/2014 Receptors her2   ER and 96% are strongly positive, PR 12% strong positive, HER-2 not amplified, Ki67 4%.   08/22/2014 Surgery   Left breast lumpectomy and sentinel lymph node biopsy    08/22/2014 Pathology Results   Left breast lumpectomy showed invasive tubular carcinoma, 0.8 cm, grade 1, margins were negative, DCIS, intermediate grade. 2 out of 7 sentinel lymph nodes had metastatic carcinoma, tumor deposit measuring 2-5 mm.     - 11/26/2014 Adjuvant Chemotherapy   On 11/26/2014, the patient completed her 4th cycle of Taxotere and Cytoxan chemotherapy.    12/28/2014 - 02/11/2015 Radiation Therapy   From 12/28/14 - 02/11/15, the patient completed radiation with 50.4 Gy in 28 fractions with a boost of 10 Gy in 5 fractions for a total dose of 60.4 Gy in 33 fractions to the right breast and  axilla.   02/25/2015 - 09/18/2015 Anti-estrogen oral therapy   From 02/25/15 - 09/18/15, the patient was on AI therapy with Letrozole by Dr. Sheran Lawless. Discontinued due to intolerable joint pain.   09/18/2015 - 02/24/2016 Anti-estrogen oral therapy   On 09/18/15, the patient was switched to Exemestane due to intolerable joint pain on Letrozole.   02/24/2016  - 03/25/2016 Anti-estrogen oral therapy   Exemestane changed to Tamoxifen due to tolerance issues. Tamoxifen discontinued after 1 month due to continuing joint pain, fatigue, and mood changes.   03/25/2016 -  Anti-estrogen oral therapy   Tamoxifen discontinued due to continuing joint pain, fatigue, and mood changes. The patient was changed back to Exemestane.   02/10/2018 Mammogram   02/10/2018 Mammogram IMPRESSION: No mammographic evidence of malignancy in either breast, status post left lumpectomy.      INTERVAL HISTORY:  Jodi Hoover is here for a follow up of  left breast cancer  She was last seen by me on 12/09/2021 She presents to the clinic alone. Pt reports she is doing pretty good. 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 Pt is stating that she has some extreme fatigue. Pt states that she has some muscle and joint pain.    All other systems were reviewed with the patient and are negative.  MEDICAL HISTORY:  Past Medical History:  Diagnosis Date   Atrial tachycardia, paroxysmal    Depression    Heart murmur    Hypertension    Personal history of chemotherapy    Personal history of radiation therapy    PVC's (premature ventricular contractions)     SURGICAL HISTORY: Past Surgical History:  Procedure Laterality Date   BREAST EXCISIONAL BIOPSY Right    BREAST LUMPECTOMY Left 08/2014   left breast lumpectomy  08/2014    I have reviewed the social history and family history with the patient and they are unchanged from previous note.  ALLERGIES:  is allergic to other.  MEDICATIONS:  Current Outpatient Medications  Medication Sig Dispense Refill   atorvastatin (LIPITOR) 20 MG tablet Take 20 mg by mouth daily.     benazepril (LOTENSIN) 20 MG tablet Take 20 mg by mouth daily.     Calcium-Magnesium-Vitamin D (CALCIUM 500 PO) Take 1 tablet by mouth daily.     COLLAGEN PO Take 1,000 mg by mouth.     exemestane (AROMASIN)  25 MG tablet TAKE ONE TABLET BY MOUTH IN THE MORNING 90 tablet 3   ferrous sulfate 325 (65 FE) MG EC tablet Take 1 tablet (325 mg total) by mouth 3 (three) times daily with meals. 90 tablet 1   methimazole (TAPAZOLE) 5 MG tablet Take 1 tablet (5 mg total) by mouth daily. 90 tablet 0   Methylsulfonylmethane (MSM) 1000 MG CAPS Take 2,000 mg by mouth daily.     topiramate (TOPAMAX) 50 MG tablet Take 50 mg by mouth 2 (two) times daily.     traMADol (ULTRAM) 50 MG tablet Take 1 tablet (50 mg total) by mouth as needed. 20 tablet 0   venlafaxine XR (EFFEXOR-XR) 150 MG 24 hr capsule Take 1 capsule (150 mg total) by mouth every morning. 90 capsule 0   No current facility-administered medications for this visit.    PHYSICAL EXAMINATION: ECOG PERFORMANCE STATUS: 0 - Asymptomatic  Vitals:   12/11/22 1515  BP: (!) 162/91  Pulse: 63  Resp: 13  Temp: 98.4 F (36.9 C)  SpO2: 98%   Wt Readings  from Last 3 Encounters:  12/11/22 149 lb 8 oz (67.8 kg)  11/04/22 150 lb (68 kg)  10/19/22 153 lb (69.4 kg)     NECK: (-) supple, thyroid normal size, non-tender, without nodularity LYMPH:(-)  no palpable lymphadenopathy in the cervical, axillary  BREAST: RT breast scar tissue, LT Breast Lumpectomy no palpable mass breast exam benign.    LABORATORY DATA:  I have reviewed the data as listed    Latest Ref Rng & Units 12/11/2022    2:59 PM 10/22/2022   10:35 AM 12/06/2020    1:05 PM  CBC  WBC 4.0 - 10.5 K/uL 9.8  8.4  7.7   Hemoglobin 12.0 - 15.0 g/dL 12.6  12.2  11.8   Hematocrit 36.0 - 46.0 % 36.7  37.4  36.8   Platelets 150 - 400 K/uL 278  287  264         Latest Ref Rng & Units 12/11/2022    2:59 PM 12/06/2020    1:05 PM 09/07/2019    9:25 AM  CMP  Glucose 70 - 99 mg/dL 92  86  94   BUN 8 - 23 mg/dL 15  19  15   $ Creatinine 0.44 - 1.00 mg/dL 0.73  0.93  0.86   Sodium 135 - 145 mmol/L 139  138  137   Potassium 3.5 - 5.1 mmol/L 3.8  4.3  4.6   Chloride 98 - 111 mmol/L 108  110  103   CO2 22 -  32 mmol/L 24  23  25   $ Calcium 8.9 - 10.3 mg/dL 9.6  9.3  9.4   Total Protein 6.5 - 8.1 g/dL 7.1  7.2  7.0   Total Bilirubin 0.3 - 1.2 mg/dL 0.4  0.5  0.8   Alkaline Phos 38 - 126 U/L 71  78  76   AST 15 - 41 U/L 15  32  22   ALT 0 - 44 U/L 10  31  19       $ RADIOGRAPHIC STUDIES: I have personally reviewed the radiological images as listed and agreed with the findings in the report. No results found.    Orders Placed This Encounter  Procedures   MM Digital Screening    Standing Status:   Future    Standing Expiration Date:   12/11/2023    Order Specific Question:   Reason for Exam (SYMPTOM  OR DIAGNOSIS REQUIRED)    Answer:   screening    Order Specific Question:   Preferred imaging location?    Answer:   [facility]   DG Bone Density    Standing Status:   Future    Standing Expiration Date:   12/11/2023    Order Specific Question:   Reason for Exam (SYMPTOM  OR DIAGNOSIS REQUIRED)    Answer:   screening    Order Specific Question:   Preferred imaging location?    Answer:   [facility]   All questions were answered. The patient knows to call the clinic with any problems, questions or concerns. No barriers to learning was detected. The total time spent in the appointment was 30 minutes.     Truitt Merle, MD 12/11/2022   Felicity Coyer, CMA, am acting as scribe for Truitt Merle, MD.   I have reviewed the above documentation for accuracy and completeness, and I agree with the above.

## 2022-12-11 NOTE — Assessment & Plan Note (Signed)
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 She is s/p left lumpectomy, adjuvant chemo TC, adjuvant Radiation therapy. She has tried several AI since starting in 02/2015. She is currently on Exemestane tolerating well with manageable hot flashes on Effexor. Plan to complete 7-10 years in total.  -She is clinically doing well. Lab reviewed. Her physical exam was unremarkable except known right breast lump. There is no clinical concern for recurrence.  -Continue surveillance. Next mammogram on 01/02/21 -Continue exemestane

## 2022-12-18 ENCOUNTER — Telehealth: Payer: Self-pay | Admitting: Family Medicine

## 2022-12-18 ENCOUNTER — Other Ambulatory Visit: Payer: Self-pay | Admitting: Family Medicine

## 2022-12-18 MED ORDER — BENAZEPRIL HCL 20 MG PO TABS: 20.0000 mg | ORAL_TABLET | Freq: Every day | ORAL | 1 refills | Status: DC

## 2022-12-18 NOTE — Telephone Encounter (Signed)
Left message on VM.

## 2022-12-18 NOTE — Telephone Encounter (Signed)
Pt need refill on med that is under her previous provider. Wants to know if we can refill?    benazepril (LOTENSIN) 20 MG tablet    [facility]

## 2022-12-18 NOTE — Telephone Encounter (Signed)
Yes

## 2022-12-18 NOTE — Telephone Encounter (Signed)
Please let patient know medications sent to [facility] and to make an appointment for HTN management

## 2023-02-21 ENCOUNTER — Other Ambulatory Visit: Payer: Self-pay | Admitting: Family Medicine

## 2023-03-26 ENCOUNTER — Other Ambulatory Visit: Payer: Self-pay | Admitting: Family Medicine

## 2023-05-11 ENCOUNTER — Other Ambulatory Visit: Payer: Self-pay | Admitting: Nurse Practitioner

## 2023-05-11 DIAGNOSIS — C50112 Malignant neoplasm of central portion of left female breast: Secondary | ICD-10-CM

## 2023-05-17 ENCOUNTER — Other Ambulatory Visit: Payer: Self-pay | Admitting: Family Medicine

## 2023-06-22 ENCOUNTER — Other Ambulatory Visit: Payer: Self-pay

## 2023-06-22 ENCOUNTER — Encounter (HOSPITAL_COMMUNITY): Payer: Self-pay | Admitting: Emergency Medicine

## 2023-06-22 ENCOUNTER — Emergency Department (HOSPITAL_COMMUNITY)
Admission: EM | Admit: 2023-06-22 | Discharge: 2023-06-22 | Disposition: A | Discharge status: Home / Self Care | Payer: Commercial Managed Care - HMO | Attending: Emergency Medicine | Admitting: Emergency Medicine

## 2023-06-22 DIAGNOSIS — W57XXXA Bitten or stung by nonvenomous insect and other nonvenomous arthropods, initial encounter: Secondary | ICD-10-CM | POA: Insufficient documentation

## 2023-06-22 DIAGNOSIS — S80862A Insect bite (nonvenomous), left lower leg, initial encounter: Secondary | ICD-10-CM | POA: Diagnosis not present

## 2023-06-22 DIAGNOSIS — I10 Essential (primary) hypertension: Secondary | ICD-10-CM | POA: Diagnosis not present

## 2023-06-22 DIAGNOSIS — S80861A Insect bite (nonvenomous), right lower leg, initial encounter: Secondary | ICD-10-CM | POA: Diagnosis present

## 2023-06-22 DIAGNOSIS — Z79899 Other long term (current) drug therapy: Secondary | ICD-10-CM | POA: Diagnosis not present

## 2023-06-22 DIAGNOSIS — T63484A Toxic effect of venom of other arthropod, undetermined, initial encounter: Secondary | ICD-10-CM

## 2023-06-22 MED ORDER — DEXAMETHASONE SODIUM PHOSPHATE 10 MG/ML IJ SOLN
10.0000 mg | Freq: Once | INTRAMUSCULAR | Status: AC
  Administered 2023-06-22: 10 mg via INTRAVENOUS
  Filled 2023-06-22: qty 1

## 2023-06-22 MED ORDER — EPINEPHRINE 0.3 MG/0.3ML IJ SOAJ: 0.3000 mg | Freq: Once | INTRAMUSCULAR | 1 refills | Status: DC | PRN

## 2023-06-22 MED ORDER — EPINEPHRINE 0.3 MG/0.3ML IJ SOAJ: 0.3000 mg | Freq: Once | INTRAMUSCULAR | 1 refills | Status: AC | PRN

## 2023-06-22 MED ORDER — PREDNISONE 20 MG PO TABS: 40.0000 mg | ORAL_TABLET | Freq: Every day | ORAL | 0 refills | Status: DC

## 2023-06-22 MED ORDER — SODIUM CHLORIDE 0.9 % IV SOLN: INTRAVENOUS | Status: DC

## 2023-06-22 MED ORDER — HYDROMORPHONE HCL 1 MG/ML IJ SOLN
1.0000 mg | Freq: Once | INTRAMUSCULAR | Status: AC
  Administered 2023-06-22: 1 mg via INTRAVENOUS
  Filled 2023-06-22: qty 1

## 2023-06-22 NOTE — Discharge Instructions (Signed)
Thankfully you do not have any signs of a severe allergic reaction however I do want you to take prednisone daily for the next 5 days as well as Benadryl as needed for itching.  I have refilled your EpiPen prescription which I want you to keep with you at all times.  Please take an anti-inflammatory such as Aleve or ibuprofen as needed for pain and come back to the ER immediately for severe or worsening symptoms.

## 2023-06-22 NOTE — ED Provider Notes (Signed)
[place] EMERGENCY DEPARTMENT AT [facility] Provider Note   CSN: [id number] Arrival date & time: 06/22/23  1842     History  Chief Complaint  Patient presents with   Insect Bite    Jodi Hoover is a 62 y.o. female.  HPI   This patient is a 62 year old female, she has a history of hypertension, high cholesterol and has been diagnosed with an allergy to bee stings.  She states that she was weed whacking at her house in the yard when she was stung by a whole bunch of insects which she thinks were bees, she was stung primarily on her bilateral legs below the knees but also 1 on her back and 1 on her ear, she had no shortness of breath, she had no swelling of her tongue, she did have immediate itching and pain at these areas so she took 10 - 25 mg Benadryl and an EpiPen, she is not sure if the EpiPen actually worked, she again has no shortness of breath.  She does have ongoing pain in these areas which she states is quite severe.  This occurred within the last hour  Home Medications Prior to Admission medications   Medication Sig Start Date End Date Taking? Authorizing Provider  EPINEPHrine (EPIPEN 2-PAK) 0.3 mg/0.3 mL IJ SOAJ injection Inject 0.3 mg into the muscle once as needed (for severe allergic reaction). CAll 911 immediately if you have to use this medicine 06/22/23  Yes Eber Hong, MD  predniSONE (DELTASONE) 20 MG tablet Take 2 tablets (40 mg total) by mouth daily. 06/22/23  Yes Eber Hong, MD  atorvastatin (LIPITOR) 20 MG tablet Take 20 mg by mouth daily. 09/10/21   [provider]  benazepril (LOTENSIN) 20 MG tablet Take 1 tablet by mouth once daily 05/17/23   Del Newman Nip, Tenna Child, FNP  Calcium-Magnesium-Vitamin D (CALCIUM 500 PO) Take 1 tablet by mouth daily.    [provider]  COLLAGEN PO Take 1,000 mg by mouth.    [provider]  exemestane (AROMASIN) 25 MG tablet TAKE ONE TABLET BY MOUTH IN THE MORNING 10/06/22   Pollyann Samples,  NP  ferrous sulfate 325 (65 FE) MG EC tablet Take 1 tablet (325 mg total) by mouth 3 (three) times daily with meals. 12/09/21   Pollyann Samples, NP  methimazole (TAPAZOLE) 5 MG tablet Take 1 tablet (5 mg total) by mouth daily. 10/27/22   Gardenia Phlegm, MD  Methylsulfonylmethane (MSM) 1000 MG CAPS Take 2,000 mg by mouth daily.    [provider]  topiramate (TOPAMAX) 50 MG tablet Take 50 mg by mouth 2 (two) times daily. 09/10/21   [provider]  traMADol (ULTRAM) 50 MG tablet Take 1 tablet (50 mg total) by mouth as needed. 09/07/19   Malachy Mood, MD  venlafaxine XR (EFFEXOR-XR) 150 MG 24 hr capsule Take 1 capsule by mouth in the morning 05/12/23   Malachy Mood, MD      Allergies    Other    Review of Systems   Review of Systems  All other systems reviewed and are negative.   Physical Exam Updated Vital Signs BP (!) 157/92   Pulse 73   Resp (!) 21   Ht 1.575 m (5\' 2" )   Wt 64.4 kg   SpO2 99%   BMI 25.97 kg/m  Physical Exam Vitals and nursing note reviewed.  Constitutional:      General: She is not in acute distress.    Appearance:  She is well-developed.  HENT:     Head: Normocephalic and atraumatic.     Mouth/Throat:     Pharynx: No oropharyngeal exudate.  Eyes:     General: No scleral icterus.       Right eye: No discharge.        Left eye: No discharge.     Conjunctiva/sclera: Conjunctivae normal.     Pupils: Pupils are equal, round, and reactive to light.  Neck:     Thyroid: No thyromegaly.     Vascular: No JVD.  Cardiovascular:     Rate and Rhythm: Normal rate and regular rhythm.     Heart sounds: Normal heart sounds. No murmur heard.    No friction rub. No gallop.  Pulmonary:     Effort: Pulmonary effort is normal. No respiratory distress.     Breath sounds: Normal breath sounds. No wheezing or rales.  Abdominal:     General: Bowel sounds are normal. There is no distension.     Palpations: Abdomen is soft. There is no mass.     Tenderness: There is  no abdominal tenderness.  Musculoskeletal:        General: Tenderness present. Normal range of motion.     Cervical back: Normal range of motion and neck supple.     Right lower leg: No edema.     Left lower leg: No edema.  Lymphadenopathy:     Cervical: No cervical adenopathy.  Skin:    General: Skin is warm and dry.     Findings: Rash present. No erythema.  Neurological:     Mental Status: She is alert.     Coordination: Coordination normal.  Psychiatric:        Behavior: Behavior normal.     ED Results / Procedures / Treatments   Labs (all labs ordered are listed, but only abnormal results are displayed) Labs Reviewed - No data to display  EKG EKG Interpretation Date/Time:  Tuesday June 22 2023 19:12:46 EDT Ventricular Rate:  68 PR Interval:  186 QRS Duration:  100 QT Interval:  393 QTC Calculation: 418 R Axis:   -24  Text Interpretation: Sinus rhythm RSR' in V1 or V2, right VCD or RVH Probable left ventricular hypertrophy Confirmed by Eber Hong ([id number]) on 06/22/2023 7:22:59 PM  Radiology No results found.  Procedures Procedures    Medications Ordered in ED Medications  0.9 %  sodium chloride infusion ( Intravenous New Bag/Given 06/22/23 1945)  dexamethasone (DECADRON) injection 10 mg (10 mg Intravenous Given 06/22/23 1945)  HYDROmorphone (DILAUDID) injection 1 mg (1 mg Intravenous Given 06/22/23 1946)    ED Course/ Medical Decision Making/ A&P Clinical Course as of 06/22/23 2159  Tue Jun 22, 2023  2119 Patient has been reevaluated multiple times, she is feeling better, in fact she is pain-free at this time.  She has a normal heart rate normal blood pressure which is slightly elevated, no symptoms of shortness of breath, she was given medications including 1 mg of hydromorphone and 10 mg of dexamethasone as well as some IV fluids. [BM]    Clinical Course User Index [BM] Eber Hong, MD                                 Medical Decision  Making Risk Prescription drug management.   Though the patient appears uncomfortable she has what appears to be multiple insect stings to her lower extremities which are  primarily on the calves and the ankles.  They are 3 or 4 mm in diameter with a small amount of surrounding redness, there is totally normal range of motion of all the major joints.  There is no signs of urticaria, her oropharynx is clear, there is no signs of swelling or shortness of breath, the vital signs are normal without tachycardia or fever, she is slightly hypertensive which is not surprising given the relative amount of Benadryl that she took.  This also explains to some degree her dry mucous membranes as she is mildly anticholinergic.  She is not hallucinating she is not febrile she is not tachycardic and is otherwise well-appearing.  Will give steroids, pain medication, we will keep her on a cardiac monitor.  Cardiac monitoring shows normal sinus rhythm  EKG performed shows normal sinus rhythm without any signs of ischemia or arrhythmia, there is some left ventricular hypertrophy present which is mild.  The patient is agreeable to the plan, I do not think she needs an EpiPen at this time  At the time of discharge at 10:00 PM the patient was reevaluated, she is speaking in full sentences she is awake and alert and has normal oxygen levels.  She has clear lungs, she is comfortable going home without any issues.  I turned her oxygen off and watched her for 45 minutes.  She had a couple of times where her oxygen went down to about 91% while she was resting peacefully but when she would talk and wake up it went back up to 9697%.  Refill EpiPen, week of prednisone, stable for discharge        Final Clinical Impression(s) / ED Diagnoses Final diagnoses:  Insect stings, undetermined intent, initial encounter    Rx / DC Orders ED Discharge Orders          Ordered    predniSONE (DELTASONE) 20 MG tablet  Daily         06/22/23 2158    EPINEPHrine (EPIPEN 2-PAK) 0.3 mg/0.3 mL IJ SOAJ injection  Once PRN        06/22/23 2159              Eber Hong, MD 06/22/23 2159

## 2023-06-22 NOTE — ED Triage Notes (Signed)
Pt reports multiple bee stings about 2 hours ago when mowing. States bee allergy and took "about 10" benadryl before arrival. No distress noted or oral swelling. States she tried to use epi pen but moved it too soon.

## 2023-06-23 ENCOUNTER — Telehealth: Payer: Self-pay

## 2023-06-23 NOTE — Transitions of Care (Post Inpatient/ED Visit) (Signed)
   06/23/2023  Name: Jodi Hoover MRN: [id number] DOB: 08-Dec-1960  Today's TOC FU Call Status: Today's TOC FU Call Status:: Unsuccessful Call (1st Attempt) Unsuccessful Call (1st Attempt) Date: 06/23/23  Attempted to reach the patient regarding the most recent Inpatient/ED visit.  Follow Up Plan: Additional outreach attempts will be made to reach the patient to complete the Transitions of Care (Post Inpatient/ED visit) call.   Signature   Woodfin Ganja LPN [facility] Nurse Health Advisor Direct Dial [phone]

## 2023-06-24 NOTE — Transitions of Care (Post Inpatient/ED Visit) (Signed)
   06/24/2023  Name: Jodi Hoover MRN: [id number] DOB: 1961/10/23  Today's TOC FU Call Status: Today's TOC FU Call Status:: Unsuccessful Call (2nd Attempt) Unsuccessful Call (1st Attempt) Date: 06/23/23 Unsuccessful Call (2nd Attempt) Date: 06/24/23  Attempted to reach the patient regarding the most recent Inpatient/ED visit.  Follow Up Plan: Additional outreach attempts will be made to reach the patient to complete the Transitions of Care (Post Inpatient/ED visit) call.   Signature   Woodfin Ganja LPN [facility] Nurse Health Advisor Direct Dial [phone]

## 2023-06-28 NOTE — Transitions of Care (Post Inpatient/ED Visit) (Signed)
   06/28/2023  Name: Jodi Hoover MRN: [id number] DOB: [birth year]  Today's TOC FU Call Status: Today's TOC FU Call Status:: Successful TOC FU Call Completed Unsuccessful Call (1st Attempt) Date: 06/23/23 Unsuccessful Call (2nd Attempt) Date: 06/24/23 Unsuccessful Call (3rd Attempt) Date: 06/28/23 [facility] FU Call Complete Date: 06/28/23  Attempted to reach the patient regarding the most recent Inpatient/ED visit.  Follow Up Plan: No further outreach attempts will be made at this time. We have been unable to contact the patient.  Signature   Woodfin Ganja LPN [facility] Nurse Health Advisor Direct Dial [phone]

## 2023-08-08 ENCOUNTER — Other Ambulatory Visit: Payer: Self-pay | Admitting: Hematology

## 2023-08-08 DIAGNOSIS — C50112 Malignant neoplasm of central portion of left female breast: Secondary | ICD-10-CM

## 2023-08-29 NOTE — Progress Notes (Unsigned)
Established Patient Office Visit   Subjective  Patient ID: Jodi Hoover, female    DOB: Aug 30, 1961  Age: 62 y.o. MRN: [id number]  No chief complaint on file.   She  has a past medical history of Atrial tachycardia, paroxysmal, Depression, Heart murmur, Hypertension, Personal history of chemotherapy, Personal history of radiation therapy, and PVC's (premature ventricular contractions).  HPI  ROS    Objective:     There were no vitals taken for this visit. {Vitals History (Optional):23777}  Physical Exam   No results found for any visits on 08/30/23.  The 10-year ASCVD risk score (Arnett DK, et al., 2019) is: 12.2%    Assessment & Plan:  There are no diagnoses linked to this encounter.  No follow-ups on file.   Cruzita Lederer Newman Nip, FNP

## 2023-08-30 ENCOUNTER — Ambulatory Visit: Payer: Managed Care, Other (non HMO) | Admitting: Family Medicine

## 2023-08-30 ENCOUNTER — Encounter: Payer: Self-pay | Admitting: Family Medicine

## 2023-08-30 VITALS — BP 153/92 | HR 68 | Ht 62.0 in | Wt 156.1 lb

## 2023-08-30 DIAGNOSIS — E038 Other specified hypothyroidism: Secondary | ICD-10-CM | POA: Diagnosis not present

## 2023-08-30 DIAGNOSIS — R7303 Prediabetes: Secondary | ICD-10-CM

## 2023-08-30 DIAGNOSIS — Z1211 Encounter for screening for malignant neoplasm of colon: Secondary | ICD-10-CM

## 2023-08-30 DIAGNOSIS — E0500 Thyrotoxicosis with diffuse goiter without thyrotoxic crisis or storm: Secondary | ICD-10-CM

## 2023-08-30 DIAGNOSIS — D509 Iron deficiency anemia, unspecified: Secondary | ICD-10-CM

## 2023-08-30 DIAGNOSIS — R7301 Impaired fasting glucose: Secondary | ICD-10-CM

## 2023-08-30 DIAGNOSIS — Z23 Encounter for immunization: Secondary | ICD-10-CM | POA: Diagnosis not present

## 2023-08-30 DIAGNOSIS — I10 Essential (primary) hypertension: Secondary | ICD-10-CM | POA: Diagnosis not present

## 2023-08-30 MED ORDER — METHIMAZOLE 5 MG PO TABS: 5.0000 mg | ORAL_TABLET | Freq: Every day | ORAL | 0 refills | Status: DC

## 2023-08-30 MED ORDER — BENAZEPRIL HCL 20 MG PO TABS: 20.0000 mg | ORAL_TABLET | Freq: Every day | ORAL | 2 refills | Status: DC

## 2023-08-30 NOTE — Patient Instructions (Signed)

## 2023-08-30 NOTE — Assessment & Plan Note (Signed)
Vitals:   08/30/23 1355 08/30/23 1413  BP: (!) 142/84 (!) 153/92   Patient reports has been off medications for 2 months and needs refills Current regimen includes Benazepril 20 mg daily  Refilled medications  Follow up in 2 weeks with at home blood pressure readings Labs ordered. Discussed with  patient to monitor their blood pressure regularly and maintain a heart-healthy diet rich in fruits, vegetables, whole grains, and low-fat dairy, while reducing sodium intake to less than 2,300 mg per day. Regular physical activity, such as 30 minutes of moderate exercise most days of the week, will help lower blood pressure and improve overall cardiovascular health. Avoiding smoking, limiting alcohol consumption, and managing stress. Take  prescribed medication, & take it as directed and avoid skipping doses. Seek emergency care if your blood pressure is (over 180/100) or you experience chest pain, shortness of breath, or sudden vision changes.Patient verbalizes understanding regarding plan of care and all questions answered.

## 2023-08-31 ENCOUNTER — Encounter (INDEPENDENT_AMBULATORY_CARE_PROVIDER_SITE_OTHER): Payer: Self-pay | Admitting: *Deleted

## 2023-09-01 ENCOUNTER — Other Ambulatory Visit: Payer: Self-pay | Admitting: Family Medicine

## 2023-09-01 DIAGNOSIS — E0500 Thyrotoxicosis with diffuse goiter without thyrotoxic crisis or storm: Secondary | ICD-10-CM

## 2023-09-01 LAB — CBC WITH DIFFERENTIAL/PLATELET
Basophils Absolute: 0.1 10*3/uL (ref 0.0–0.2)
Basos: 1 %
EOS (ABSOLUTE): 0.5 10*3/uL — ABNORMAL HIGH (ref 0.0–0.4)
Eos: 5 %
Hematocrit: 39.8 % (ref 34.0–46.6)
Hemoglobin: 13 g/dL (ref 11.1–15.9)
Immature Grans (Abs): 0 10*3/uL (ref 0.0–0.1)
Immature Granulocytes: 0 %
Lymphocytes Absolute: 3.3 10*3/uL — ABNORMAL HIGH (ref 0.7–3.1)
Lymphs: 35 %
MCH: 30.6 pg (ref 26.6–33.0)
MCHC: 32.7 g/dL (ref 31.5–35.7)
MCV: 94 fL (ref 79–97)
Monocytes Absolute: 0.7 10*3/uL (ref 0.1–0.9)
Monocytes: 8 %
Neutrophils Absolute: 4.8 10*3/uL (ref 1.4–7.0)
Neutrophils: 51 %
Platelets: 330 10*3/uL (ref 150–450)
RBC: 4.25 x10E6/uL (ref 3.77–5.28)
RDW: 12.2 % (ref 11.7–15.4)
WBC: 9.4 10*3/uL (ref 3.4–10.8)

## 2023-09-01 LAB — IRON,TIBC AND FERRITIN PANEL
Ferritin: 61 ng/mL (ref 15–150)
Iron Saturation: 15 % (ref 15–55)
Iron: 59 ug/dL (ref 27–139)
Total Iron Binding Capacity: 381 ug/dL (ref 250–450)
UIBC: 322 ug/dL (ref 118–369)

## 2023-09-01 LAB — TSH+FREE T4
Free T4: 1.14 ng/dL (ref 0.82–1.77)
TSH: 0.365 u[IU]/mL — ABNORMAL LOW (ref 0.450–4.500)

## 2023-09-01 LAB — LIPID PANEL
Chol/HDL Ratio: 3.7 ratio (ref 0.0–4.4)
Cholesterol, Total: 254 mg/dL — ABNORMAL HIGH (ref 100–199)
HDL: 69 mg/dL (ref >39)
LDL Chol Calc (NIH): 166 mg/dL — ABNORMAL HIGH (ref 0–99)
Triglycerides: 109 mg/dL (ref 0–149)
VLDL Cholesterol Cal: 19 mg/dL (ref 5–40)

## 2023-09-01 LAB — BMP8+EGFR
BUN/Creatinine Ratio: 16 (ref 12–28)
BUN: 15 mg/dL (ref 8–27)
CO2: 21 mmol/L (ref 20–29)
Calcium: 10 mg/dL (ref 8.7–10.3)
Chloride: 105 mmol/L (ref 96–106)
Creatinine, Ser: 0.93 mg/dL (ref 0.57–1.00)
Glucose: 92 mg/dL (ref 70–99)
Potassium: 4.4 mmol/L (ref 3.5–5.2)
Sodium: 140 mmol/L (ref 134–144)
eGFR: 69 mL/min/{1.73_m2} (ref >59)

## 2023-09-01 LAB — THYROID STIMULATING IMMUNOGLOBULIN: Thyroid Stim Immunoglobulin: 0.58 IU/L — ABNORMAL HIGH (ref 0.00–0.55)

## 2023-09-01 LAB — HEMOGLOBIN A1C
Est. average glucose Bld gHb Est-mCnc: 126 mg/dL
Hgb A1c MFr Bld: 6 % — ABNORMAL HIGH (ref 4.8–5.6)

## 2023-09-01 LAB — THYROID PEROXIDASE ANTIBODY: Thyroperoxidase Ab SerPl-aCnc: >600 [IU]/mL — ABNORMAL HIGH (ref 0–34)

## 2023-09-01 MED ORDER — ATORVASTATIN CALCIUM 40 MG PO TABS: 40.0000 mg | ORAL_TABLET | Freq: Every day | ORAL | 3 refills | Status: DC

## 2023-09-01 NOTE — Progress Notes (Signed)
Please inform patient,   Cholesterol levels elevated,  Plan of treatment will include increasing Lipitor to 40 mg to be taken once daily- medications sent to pharmacy   Your 10-year ASCVD risk score is 8.8% meaning you have a   8.8 % chance of having a heart attack or stroke in the next 10 years if no changes are made. This suggests a higher risk, and it's important to take steps to lower it, like improving your diet, exercising, managing blood pressure and cholesterol, Take your daily cholesterol medications as prescribed. By making these changes,  can help reduce your risk and improve your heart health.   Start lifestyle modifications follow diet low in saturated fat, reduce dietary salt intake, avoid fatty foods, maintain an exercise routine 3 to 5 days a week for a minimum total of 150 minutes.   Thyroid panel fluctuating  Referral placed to endocrinology for management of Graves disease

## 2023-09-13 ENCOUNTER — Other Ambulatory Visit: Payer: Self-pay | Admitting: Family Medicine

## 2023-09-13 NOTE — Progress Notes (Signed)
Patient at home blood pressure readings on Benazepril 20 mg daily   120/78 121/77 125/86 120/80 104/78 114/79 110/78 112/81 127/79

## 2023-09-29 LAB — COLOGUARD: COLOGUARD: NEGATIVE

## 2023-10-24 ENCOUNTER — Other Ambulatory Visit: Payer: Self-pay | Admitting: Nurse Practitioner

## 2023-10-24 DIAGNOSIS — Z170 Estrogen receptor positive status [ER+]: Secondary | ICD-10-CM

## 2023-11-05 ENCOUNTER — Other Ambulatory Visit: Payer: Self-pay | Admitting: Hematology

## 2023-11-05 DIAGNOSIS — C50112 Malignant neoplasm of central portion of left female breast: Secondary | ICD-10-CM

## 2023-11-11 ENCOUNTER — Telehealth (INDEPENDENT_AMBULATORY_CARE_PROVIDER_SITE_OTHER): Payer: Self-pay | Admitting: Gastroenterology

## 2023-11-11 NOTE — Telephone Encounter (Signed)
 Who is your primary care physician: [facility]  Reasons for the colonoscopy: recall  Have you had a colonoscopy before?  Yes 2014  Do you have family history of colon cancer? no  Previous colonoscopy with polyps removed? no  Do you have a history colorectal cancer?   no  Are you diabetic? If yes, Type 1 or Type 2?    no  Do you have a prosthetic or mechanical heart valve? no  Do you have a pacemaker/defibrillator?   no  Have you had endocarditis/atrial fibrillation? no  Have you had joint replacement within the last 12 months?  no  Do you tend to be constipated or have to use laxatives? no  Do you have any history of drugs or alchohol?  no  Do you use supplemental oxygen?  no  Have you had a stroke or heart attack within the last 6 months? no  Do you take weight loss medication?  no  For female patients: have you had a hysterectomy?  no                                     are you post menopausal?       yes                                            do you still have your menstrual cycle? no      Do you take any blood-thinning medications such as: (aspirin, warfarin, Plavix, Aggrenox)  no  If yes we need the name, milligram, dosage and who is prescribing doctor  Current Outpatient Medications on File Prior to Visit  Medication Sig Dispense Refill   atorvastatin  (LIPITOR) 40 MG tablet Take 1 tablet (40 mg total) by mouth daily. 90 tablet 3   benazepril  (LOTENSIN ) 20 MG tablet Take 1 tablet (20 mg total) by mouth daily. 30 tablet 2   Calcium -Magnesium-Vitamin D  (CALCIUM  500 PO) Take 1 tablet by mouth daily.     exemestane  (AROMASIN ) 25 MG tablet TAKE ONE TABLET BY MOUTH IN THE MORNING 90 tablet 3   methimazole  (TAPAZOLE ) 5 MG tablet Take 1 tablet (5 mg total) by mouth daily. 90 tablet 0   venlafaxine  XR (EFFEXOR -XR) 150 MG 24 hr capsule Take 1 capsule by mouth in the morning 90 capsule 1   COLLAGEN PO Take 1,000 mg by mouth. (Patient not taking:  Reported on 08/30/2023)     EPINEPHrine  (EPIPEN  2-PAK) 0.3 mg/0.3 mL IJ SOAJ injection Inject 0.3 mg into the muscle once as needed (for severe allergic reaction). CAll 911 immediately if you have to use this medicine (Patient not taking: Reported on 11/11/2023) 2 each 1   ferrous sulfate  325 (65 FE) MG EC tablet Take 1 tablet (325 mg total) by mouth 3 (three) times daily with meals. (Patient not taking: Reported on 11/11/2023) 90 tablet 1   Methylsulfonylmethane (MSM) 1000 MG CAPS Take 2,000 mg by mouth daily. (Patient not taking: Reported on 11/11/2023)     predniSONE  (DELTASONE ) 20 MG tablet Take 2 tablets (40 mg total) by mouth daily. (Patient not taking: Reported on 11/11/2023) 10 tablet 0   topiramate (TOPAMAX) 50 MG tablet Take 50 mg by mouth 2 (two) times daily. (Patient not taking: Reported on 11/11/2023)  traMADol  (ULTRAM ) 50 MG tablet Take 1 tablet (50 mg total) by mouth as needed. (Patient not taking: Reported on 11/11/2023) 20 tablet 0   No current facility-administered medications on file prior to visit.    Allergies  Allergen Reactions   Other Anaphylaxis    Bee sting  -  Go to ER     [facility]: [facility]  Primary Insurance Name: Sherleen Paterson number where you can be reached: [phone]

## 2023-11-11 NOTE — Telephone Encounter (Signed)
Room 1 , 2 day prep Thanks 

## 2023-11-16 ENCOUNTER — Encounter (INDEPENDENT_AMBULATORY_CARE_PROVIDER_SITE_OTHER): Payer: Self-pay | Admitting: *Deleted

## 2023-11-16 MED ORDER — PEG 3350-KCL-NA BICARB-NACL 420 G PO SOLR: 4000.0000 mL | Freq: Once | ORAL | 0 refills | Status: AC

## 2023-11-16 NOTE — Telephone Encounter (Signed)
 Referral completed, [facility] apt letter sent to PCP

## 2023-11-16 NOTE — Addendum Note (Signed)
 Addended by: Marlowe Shores on: 11/16/2023 12:13 PM   Modules accepted: Orders

## 2023-11-16 NOTE — Telephone Encounter (Signed)
 Pt contacted and scheduled. Prep sent to pharmacy. No pa needed per insurance. Instructions mailed to pt

## 2023-11-22 ENCOUNTER — Other Ambulatory Visit: Payer: Self-pay | Admitting: Family Medicine

## 2023-11-22 DIAGNOSIS — E0500 Thyrotoxicosis with diffuse goiter without thyrotoxic crisis or storm: Secondary | ICD-10-CM

## 2023-12-10 ENCOUNTER — Telehealth: Payer: Self-pay | Admitting: Hematology

## 2023-12-10 NOTE — Telephone Encounter (Signed)
 Canceled appointments per patients request via incoming call. Patient states she will call back into the office when she is ready to schedule.

## 2023-12-13 ENCOUNTER — Other Ambulatory Visit: Payer: Commercial Managed Care - HMO

## 2023-12-13 ENCOUNTER — Ambulatory Visit: Payer: Commercial Managed Care - HMO | Admitting: Hematology

## 2023-12-15 ENCOUNTER — Encounter (HOSPITAL_COMMUNITY)
Admission: RE | Disposition: A | Discharge status: Home / Self Care | Payer: Self-pay | Source: Ambulatory Visit | Attending: Gastroenterology

## 2023-12-15 ENCOUNTER — Encounter (HOSPITAL_COMMUNITY): Payer: Self-pay | Admitting: Gastroenterology

## 2023-12-15 ENCOUNTER — Other Ambulatory Visit: Payer: Self-pay

## 2023-12-15 ENCOUNTER — Ambulatory Visit (HOSPITAL_COMMUNITY): Payer: Commercial Managed Care - HMO | Admitting: Anesthesiology

## 2023-12-15 ENCOUNTER — Ambulatory Visit (HOSPITAL_COMMUNITY)
Admission: RE | Admit: 2023-12-15 | Discharge: 2023-12-15 | Disposition: A | Discharge status: Home / Self Care | Payer: Commercial Managed Care - HMO | Source: Ambulatory Visit | Attending: Gastroenterology | Admitting: Gastroenterology

## 2023-12-15 DIAGNOSIS — I493 Ventricular premature depolarization: Secondary | ICD-10-CM | POA: Diagnosis not present

## 2023-12-15 DIAGNOSIS — Z79899 Other long term (current) drug therapy: Secondary | ICD-10-CM | POA: Insufficient documentation

## 2023-12-15 DIAGNOSIS — Z79811 Long term (current) use of aromatase inhibitors: Secondary | ICD-10-CM | POA: Diagnosis not present

## 2023-12-15 DIAGNOSIS — Z7952 Long term (current) use of systemic steroids: Secondary | ICD-10-CM | POA: Diagnosis not present

## 2023-12-15 DIAGNOSIS — D124 Benign neoplasm of descending colon: Secondary | ICD-10-CM

## 2023-12-15 DIAGNOSIS — K648 Other hemorrhoids: Secondary | ICD-10-CM

## 2023-12-15 DIAGNOSIS — Z9221 Personal history of antineoplastic chemotherapy: Secondary | ICD-10-CM | POA: Diagnosis not present

## 2023-12-15 DIAGNOSIS — I10 Essential (primary) hypertension: Secondary | ICD-10-CM | POA: Diagnosis not present

## 2023-12-15 DIAGNOSIS — F32A Depression, unspecified: Secondary | ICD-10-CM | POA: Diagnosis not present

## 2023-12-15 DIAGNOSIS — Z853 Personal history of malignant neoplasm of breast: Secondary | ICD-10-CM | POA: Insufficient documentation

## 2023-12-15 DIAGNOSIS — Z1211 Encounter for screening for malignant neoplasm of colon: Secondary | ICD-10-CM

## 2023-12-15 DIAGNOSIS — Z923 Personal history of irradiation: Secondary | ICD-10-CM | POA: Diagnosis not present

## 2023-12-15 DIAGNOSIS — F17290 Nicotine dependence, other tobacco product, uncomplicated: Secondary | ICD-10-CM | POA: Insufficient documentation

## 2023-12-15 DIAGNOSIS — F17210 Nicotine dependence, cigarettes, uncomplicated: Secondary | ICD-10-CM | POA: Diagnosis not present

## 2023-12-15 HISTORY — PX: POLYPECTOMY: SHX5525

## 2023-12-15 HISTORY — PX: COLONOSCOPY WITH PROPOFOL: SHX5780

## 2023-12-15 LAB — HM COLONOSCOPY

## 2023-12-15 SURGERY — COLONOSCOPY WITH PROPOFOL
Anesthesia: General

## 2023-12-15 MED ORDER — PROPOFOL 500 MG/50ML IV EMUL
INTRAVENOUS | Status: DC | PRN
Start: 2023-12-15 — End: 2023-12-15
  Administered 2023-12-15: 30 mg via INTRAVENOUS
  Administered 2023-12-15: 150 ug/kg/min via INTRAVENOUS
  Administered 2023-12-15: 80 mg via INTRAVENOUS

## 2023-12-15 MED ORDER — LACTATED RINGERS IV SOLN: INTRAVENOUS | Status: DC

## 2023-12-15 MED ORDER — FENTANYL CITRATE (PF) 100 MCG/2ML IJ SOLN: 25.0000 ug | INTRAMUSCULAR | Status: DC | PRN

## 2023-12-15 MED ORDER — MEPERIDINE HCL 50 MG/ML IJ SOLN: 6.2500 mg | INTRAMUSCULAR | Status: DC | PRN

## 2023-12-15 MED ORDER — METOCLOPRAMIDE HCL 5 MG/ML IJ SOLN: 10.0000 mg | Freq: Once | INTRAMUSCULAR | Status: DC | PRN

## 2023-12-15 NOTE — Anesthesia Preprocedure Evaluation (Signed)
Anesthesia Evaluation  Patient identified by MRN, date of birth, ID band Patient awake    Reviewed: Allergy & Precautions, H&P , NPO status , Patient's Chart, lab work & pertinent test results  Airway Mallampati: II  TM Distance: >3 FB Neck ROM: Full    Dental no notable dental hx.    Pulmonary neg pulmonary ROS, Current Smoker   Pulmonary exam normal breath sounds clear to auscultation       Cardiovascular hypertension, Normal cardiovascular exam+ Valvular Problems/Murmurs  Rhythm:Regular Rate:Normal     Neuro/Psych  PSYCHIATRIC DISORDERS  Depression    negative neurological ROS     GI/Hepatic negative GI ROS, Neg liver ROS,,,  Endo/Other  negative endocrine ROS    Renal/GU negative Renal ROS  negative genitourinary   Musculoskeletal negative musculoskeletal ROS (+)    Abdominal   Peds negative pediatric ROS (+)  Hematology negative hematology ROS (+)   Anesthesia Other Findings   Reproductive/Obstetrics negative OB ROS                             Anesthesia Physical Anesthesia Plan  ASA: 2  Anesthesia Plan: General   Post-op Pain Management: Minimal or no pain anticipated   Induction: Intravenous  PONV Risk Score and Plan: 1 and Propofol infusion  Airway Management Planned: Simple Face Mask and Nasal Cannula  Additional Equipment:   Intra-op Plan:   Post-operative Plan:   Informed Consent: I have reviewed the patients History and Physical, chart, labs and discussed the procedure including the risks, benefits and alternatives for the proposed anesthesia with the patient or authorized representative who has indicated his/her understanding and acceptance.       Plan Discussed with: CRNA  Anesthesia Plan Comments:        Anesthesia Quick Evaluation

## 2023-12-15 NOTE — Anesthesia Postprocedure Evaluation (Signed)
Anesthesia Post Note  Patient: Jodi Hoover  Procedure(s) Performed: COLONOSCOPY WITH PROPOFOL POLYPECTOMY  Patient location during evaluation: PACU Anesthesia Type: General Level of consciousness: awake and alert Pain management: pain level controlled Vital Signs Assessment: post-procedure vital signs reviewed and stable Respiratory status: spontaneous breathing, nonlabored ventilation and respiratory function stable Cardiovascular status: blood pressure returned to baseline and stable Postop Assessment: no apparent nausea or vomiting Anesthetic complications: no   No notable events documented.   Last Vitals:  Vitals:   12/15/23 0856 12/15/23 1051  BP: 130/78 102/65  Pulse: 64 (!) 56  Resp: 13 12  Temp: 36.9 C 36.4 C  SpO2: 95% 98%    Last Pain:  Vitals:   12/15/23 1059  TempSrc:   PainSc: 0-No pain                 Roslynn Amble

## 2023-12-15 NOTE — Discharge Instructions (Addendum)
You are being discharged to home.  Resume your previous diet.  We are waiting for your pathology results.  Your physician has recommended a repeat colonoscopy in two years because the bowel preparation was suboptimal. No Miralax + Gatorade or Sutab prep.

## 2023-12-15 NOTE — H&P (Signed)
Jodi Hoover is an 63 y.o. female.   Chief Complaint: colorectal cancer screening. HPI: 63 year old female with past medical history of depression, hypertension, PVCs, breast cancer, coming for colorectal cancer screening.  Last colonoscopy was performed 10 years ago, patient reports it was normal.  The patient denies having any complaints such as melena, hematochezia, abdominal pain or distention, change in her bowel movement consistency or frequency, no changes in weight recently.  No family history of colorectal cancer.   Past Medical History:  Diagnosis Date   Atrial tachycardia, paroxysmal (HCC)    Depression    Heart murmur    Hypertension    Personal history of chemotherapy    Personal history of radiation therapy    PVC's (premature ventricular contractions)     Past Surgical History:  Procedure Laterality Date   BREAST EXCISIONAL BIOPSY Right    BREAST LUMPECTOMY Left 08/2014   left breast lumpectomy  08/2014    Family History  Problem Relation Age of Onset   Hypertension Mother    Cancer Paternal Aunt        lung cancer   Social History:  reports that she has been smoking cigarettes and e-cigarettes. She has a 16 pack-year smoking history. She has never used smokeless tobacco. She reports current alcohol use. She reports that she does not currently use drugs after having used the following drugs: Marijuana.  Allergies:  Allergies  Allergen Reactions   Other Anaphylaxis    Bee sting  -  Go to ER    Medications Prior to Admission  Medication Sig Dispense Refill   atorvastatin (LIPITOR) 40 MG tablet Take 1 tablet (40 mg total) by mouth daily. 90 tablet 3   benazepril (LOTENSIN) 20 MG tablet Take 1 tablet (20 mg total) by mouth daily. 30 tablet 2   Calcium-Magnesium-Vitamin D (CALCIUM 500 PO) Take 1 tablet by mouth daily.     exemestane (AROMASIN) 25 MG tablet TAKE ONE TABLET BY MOUTH IN THE MORNING 90 tablet 3   methimazole (TAPAZOLE) 5 MG tablet Take 1 tablet by  mouth once daily 90 tablet 0   Methylsulfonylmethane (MSM) 1000 MG CAPS Take 2,000 mg by mouth daily.     venlafaxine XR (EFFEXOR-XR) 150 MG 24 hr capsule Take 1 capsule by mouth in the morning 90 capsule 1   COLLAGEN PO Take 1,000 mg by mouth. (Patient not taking: Reported on 08/30/2023)     EPINEPHrine (EPIPEN 2-PAK) 0.3 mg/0.3 mL IJ SOAJ injection Inject 0.3 mg into the muscle once as needed (for severe allergic reaction). CAll 911 immediately if you have to use this medicine (Patient not taking: Reported on 11/11/2023) 2 each 1   ferrous sulfate 325 (65 FE) MG EC tablet Take 1 tablet (325 mg total) by mouth 3 (three) times daily with meals. (Patient not taking: Reported on 11/11/2023) 90 tablet 1   predniSONE (DELTASONE) 20 MG tablet Take 2 tablets (40 mg total) by mouth daily. (Patient not taking: Reported on 08/30/2023) 10 tablet 0   topiramate (TOPAMAX) 50 MG tablet Take 50 mg by mouth 2 (two) times daily. (Patient not taking: Reported on 11/11/2023)     traMADol (ULTRAM) 50 MG tablet Take 1 tablet (50 mg total) by mouth as needed. (Patient not taking: Reported on 11/11/2023) 20 tablet 0    No results found for this or any previous visit (from the past 48 hours). No results found.  Review of Systems  All other systems reviewed and are negative.   Blood  pressure 130/78, pulse 64, temperature 98.4 F (36.9 C), temperature source Oral, resp. rate 13, height 5' 2.5" (1.588 m), weight 65.8 kg, SpO2 95%. Physical Exam  GENERAL: The patient is AO x3, in no acute distress. HEENT: Head is normocephalic and atraumatic. EOMI are intact. Mouth is well hydrated and without lesions. NECK: Supple. No masses LUNGS: Clear to auscultation. No presence of rhonchi/wheezing/rales. Adequate chest expansion HEART: RRR, normal s1 and s2. ABDOMEN: Soft, nontender, no guarding, no peritoneal signs, and nondistended. BS +. No masses. EXTREMITIES: Without any cyanosis, clubbing, rash, lesions or edema. NEUROLOGIC:  AOx3, no focal motor deficit. SKIN: no jaundice, no rashes  Assessment/Plan 63 year old female with past medical history of depression, hypertension, PVCs, breast cancer, coming for colorectal cancer screening.  Will proceed with colonoscopy.  Dolores Frame, MD 12/15/2023, 9:51 AM

## 2023-12-15 NOTE — Op Note (Signed)
[facility] Patient Name: Jodi Hoover Procedure Date: 12/15/2023 9:51 AM MRN: [id number] Date of Birth: [birth year] Attending MD: Katrinka Blazing , , [id number] CSN: [id number] Age: 63 Admit Type: Outpatient Procedure:                Colonoscopy Indications:              Screening for colorectal malignant neoplasm Providers:                Katrinka Blazing, Francoise Ceo RN, RN, Elinor Parkinson Referring MD:              Medicines:                Monitored Anesthesia Care Complications:            No immediate complications. Estimated Blood Loss:     Estimated blood loss: none. Procedure:                Pre-Anesthesia Assessment:                           - Prior to the procedure, a History and Physical                            was performed, and patient medications, allergies                            and sensitivities were reviewed. The patient's                            tolerance of previous anesthesia was reviewed.                           - The risks and benefits of the procedure and the                            sedation options and risks were discussed with the                            patient. All questions were answered and informed                            consent was obtained.                           - ASA Grade Assessment: II - A patient with mild                            systemic disease.                           After obtaining informed consent, the colonoscope                            was passed under direct vision. Throughout the  procedure, the patient's blood pressure, pulse, and                            oxygen saturations were monitored continuously. The                            PCF-HQ190L ([id number]) scope was introduced through                            the anus and advanced to the the cecum, identified                            by appendiceal orifice and ileocecal valve. The                             patient tolerated the procedure well. The quality                            of the bowel preparation was fair. The PCF-HQ190L                            ([id number]) scope was introduced through the anus and                            advanced to the the transverse colon for                            evaluation. This was the intended extent as the                            proximal area were evaluated with other scope                            before it got clogged with debris. The colonoscopy                            was performed with difficulty due to inadequate                            bowel prep. Successful completion of the procedure                            was aided by lavage. Scope In: 10:00:05 AM Scope Out: 10:46:05 AM Scope Withdrawal Time: 0 hours 30 minutes 28 seconds  Total Procedure Duration: 0 hours 46 minutes 0 seconds  Findings:      The perianal and digital rectal examinations were normal.      A 5 mm polyp was found in the descending colon. The polyp was sessile.       The polyp was removed with a cold snare. Resection and retrieval were       complete.      Non-bleeding internal hemorrhoids were found during retroflexion. The       hemorrhoids were small. Impression:               -  Preparation of the colon was fair. There was                            significant amount of seed debris which clogged the                            colonoscope. A second colonoscope was reintroduced                            to obtain better visualization.                           - One 5 mm polyp in the descending colon, removed                            with a cold snare. Resected and retrieved.                           - Non-bleeding internal hemorrhoids. Moderate Sedation:      Per Anesthesia Care Recommendation:           - Discharge patient to home (ambulatory).                           - Resume previous diet.                           - Await  pathology results.                           - Repeat colonoscopy in 2 years because the bowel                            preparation was suboptimal. No Miralax + Gatorade                            or Sutab prep. Procedure Code(s):        --- Professional ---                           [phone], 52, Colonoscopy, flexible; with removal of                            tumor(s), polyp(s), or other lesion(s) by snare                            technique Diagnosis Code(s):        --- Professional ---                           Z12.11, Encounter for screening for malignant                            neoplasm of colon  K64.8, Other hemorrhoids                           D12.4, Benign neoplasm of descending colon CPT copyright 2022 [employer]. All rights reserved. The codes documented in this report are preliminary and upon coder review may  be revised to meet current compliance requirements. Katrinka Blazing, MD Katrinka Blazing,  12/15/2023 10:57:26 AM This report has been signed electronically. Number of Addenda: 0

## 2023-12-15 NOTE — Transfer of Care (Addendum)
Immediate Anesthesia Transfer of Care Note  Patient: Jodi Hoover  Procedure(s) Performed: COLONOSCOPY WITH PROPOFOL POLYPECTOMY  Patient Location: Endoscopy Unit  Anesthesia Type:General  Level of Consciousness: drowsy and patient cooperative  Airway & Oxygen Therapy: Patient Spontanous Breathing and Patient connected to nasal cannula oxygen  Post-op Assessment: Report given to RN and Post -op Vital signs reviewed and stable  Post vital signs: Reviewed and stable  Last Vitals:  Vitals Value Taken Time  BP 102/65 12/15/23   1051  Temp 36.4 12/15/23   1051  Pulse 57 12/15/23   1051  Resp 12 12/15/23   1051  SpO2 98% 12/15/23   1051    Last Pain:  Vitals:   12/15/23 0955  TempSrc:   PainSc: 0-No pain      Patients Stated Pain Goal: 8 (12/15/23 0856)  Complications: No notable events documented.

## 2023-12-16 ENCOUNTER — Encounter (HOSPITAL_COMMUNITY): Payer: Self-pay | Admitting: Gastroenterology

## 2023-12-16 ENCOUNTER — Encounter (INDEPENDENT_AMBULATORY_CARE_PROVIDER_SITE_OTHER): Payer: Self-pay | Admitting: *Deleted

## 2023-12-16 LAB — SURGICAL PATHOLOGY

## 2023-12-17 ENCOUNTER — Encounter (INDEPENDENT_AMBULATORY_CARE_PROVIDER_SITE_OTHER): Payer: Self-pay | Admitting: *Deleted

## 2023-12-17 ENCOUNTER — Other Ambulatory Visit: Payer: Self-pay | Admitting: Family Medicine

## 2023-12-26 NOTE — Progress Notes (Signed)
 Polyp was resected from the descending colon, as stated in op note (path note location is not correct)

## 2023-12-29 ENCOUNTER — Ambulatory Visit (INDEPENDENT_AMBULATORY_CARE_PROVIDER_SITE_OTHER): Payer: Commercial Managed Care - HMO

## 2023-12-29 ENCOUNTER — Ambulatory Visit
Admission: EM | Admit: 2023-12-29 | Discharge: 2023-12-29 | Disposition: A | Discharge status: Home / Self Care | Payer: Commercial Managed Care - HMO | Attending: Family Medicine | Admitting: Family Medicine

## 2023-12-29 DIAGNOSIS — M79642 Pain in left hand: Secondary | ICD-10-CM

## 2023-12-29 NOTE — Discharge Instructions (Signed)
 Ice, elevate, ibuprofen and Tylenol as needed.  We will let you know if anything comes back abnormal on the x-ray.
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
## 2024-01-17 ENCOUNTER — Other Ambulatory Visit: Payer: Self-pay | Admitting: Family Medicine

## 2024-02-14 ENCOUNTER — Other Ambulatory Visit: Payer: Self-pay | Admitting: Family Medicine

## 2024-02-22 ENCOUNTER — Other Ambulatory Visit: Payer: Self-pay | Admitting: Family Medicine

## 2024-02-22 DIAGNOSIS — E0500 Thyrotoxicosis with diffuse goiter without thyrotoxic crisis or storm: Secondary | ICD-10-CM

## 2024-03-14 ENCOUNTER — Other Ambulatory Visit: Payer: Self-pay | Admitting: Family Medicine

## 2024-04-29 ENCOUNTER — Other Ambulatory Visit: Payer: Self-pay | Admitting: Nurse Practitioner

## 2024-04-29 DIAGNOSIS — C50112 Malignant neoplasm of central portion of left female breast: Secondary | ICD-10-CM

## 2024-05-01 NOTE — Telephone Encounter (Signed)
 Per Dr. Demetra last office note pt is to stop Exemestane  in April 2024

## 2024-05-04 ENCOUNTER — Other Ambulatory Visit: Payer: Self-pay | Admitting: Family Medicine

## 2024-05-22 ENCOUNTER — Other Ambulatory Visit: Payer: Self-pay | Admitting: Family Medicine

## 2024-05-22 DIAGNOSIS — E0500 Thyrotoxicosis with diffuse goiter without thyrotoxic crisis or storm: Secondary | ICD-10-CM

## 2024-05-25 ENCOUNTER — Encounter: Payer: Self-pay | Admitting: Family Medicine

## 2024-05-25 ENCOUNTER — Ambulatory Visit: Admitting: Family Medicine

## 2024-05-25 ENCOUNTER — Ambulatory Visit (HOSPITAL_COMMUNITY)
Admission: RE | Admit: 2024-05-25 | Discharge: 2024-05-25 | Disposition: A | Discharge status: Home / Self Care | Source: Ambulatory Visit | Attending: Family Medicine | Admitting: Family Medicine

## 2024-05-25 VITALS — BP 110/66 | HR 70 | Ht 62.5 in | Wt 150.0 lb

## 2024-05-25 DIAGNOSIS — M25511 Pain in right shoulder: Secondary | ICD-10-CM | POA: Diagnosis present

## 2024-05-25 DIAGNOSIS — E559 Vitamin D deficiency, unspecified: Secondary | ICD-10-CM

## 2024-05-25 DIAGNOSIS — I10 Essential (primary) hypertension: Secondary | ICD-10-CM

## 2024-05-25 DIAGNOSIS — R7303 Prediabetes: Secondary | ICD-10-CM

## 2024-05-25 DIAGNOSIS — G8929 Other chronic pain: Secondary | ICD-10-CM | POA: Insufficient documentation

## 2024-05-25 DIAGNOSIS — E038 Other specified hypothyroidism: Secondary | ICD-10-CM | POA: Diagnosis not present

## 2024-05-25 DIAGNOSIS — F339 Major depressive disorder, recurrent, unspecified: Secondary | ICD-10-CM

## 2024-05-25 MED ORDER — BENAZEPRIL HCL 20 MG PO TABS: 20.0000 mg | ORAL_TABLET | Freq: Every day | ORAL | 1 refills | Status: DC

## 2024-05-25 MED ORDER — VENLAFAXINE HCL ER 150 MG PO CP24: 150.0000 mg | ORAL_CAPSULE | Freq: Every morning | ORAL | 1 refills | Status: DC

## 2024-05-25 MED ORDER — DOXYCYCLINE HYCLATE 100 MG PO TABS: 100.0000 mg | ORAL_TABLET | Freq: Two times a day (BID) | ORAL | 0 refills | Status: AC

## 2024-05-25 NOTE — Progress Notes (Signed)
 Established Patient Office Visit   Subjective  Patient ID: Jodi Hoover, female    DOB: 1961-07-31  Age: 63 y.o. MRN: [id number]  No chief complaint on file.   She  has a past medical history of Atrial tachycardia, paroxysmal (HCC), Depression, Heart murmur, Hypertension, Personal history of chemotherapy, Personal history of radiation therapy, and PVC's (premature ventricular contractions).  HPI Patient presents to the clinic for chronic follow up. For the details of today's visit, please refer to assessment and plan.   Review of Systems  Constitutional:  Negative for chills and fever.  Eyes:  Positive for blurred vision.  Respiratory:  Negative for shortness of breath.   Cardiovascular:  Negative for chest pain.  Genitourinary:  Negative for dysuria.  Musculoskeletal:        Right shoulder pain  Neurological:  Negative for headaches.      Objective:     BP 110/66   Pulse 70   Ht 5' 2.5 (1.588 m)   Wt 150 lb (68 kg)   SpO2 97%   BMI 27.00 kg/m  BP Readings from Last 3 Encounters:  05/25/24 110/66  12/29/23 (!) 141/82  12/15/23 102/65      Physical Exam Vitals reviewed.  Constitutional:      General: She is not in acute distress.    Appearance: Normal appearance. She is not ill-appearing, toxic-appearing or diaphoretic.  HENT:     Head: Normocephalic.  Eyes:     General:        Right eye: No discharge.        Left eye: No discharge.     Conjunctiva/sclera: Conjunctivae normal.     Pupils: Pupils are equal, round, and reactive to light.  Cardiovascular:     Rate and Rhythm: Normal rate.     Pulses: Normal pulses.     Heart sounds: Normal heart sounds.  Pulmonary:     Effort: Pulmonary effort is normal. No respiratory distress.     Breath sounds: Normal breath sounds.  Abdominal:     General: Bowel sounds are normal.     Palpations: Abdomen is soft.     Tenderness: There is no abdominal tenderness. There is no right CVA tenderness, left CVA tenderness  or guarding.  Skin:    General: Skin is warm and dry.  Neurological:     Mental Status: She is alert.     Coordination: Coordination normal.     Gait: Gait normal.  Psychiatric:        Mood and Affect: Mood normal.      No results found for any visits on 05/25/24.  The 10-year ASCVD risk score (Arnett DK, et al., 2019) is: 8.9%    Assessment & Plan:  Primary hypertension -     CMP14+EGFR -     Lipid panel -     CBC with Differential/Platelet -     Benazepril  HCl; Take 1 tablet (20 mg total) by mouth daily.  Dispense: 90 tablet; Refill: 1  Vitamin D  deficiency -     VITAMIN D  25 Hydroxy (Vit-D Deficiency, Fractures)  TSH (thyroid -stimulating hormone deficiency) -     TSH + free T4  Prediabetes -     Hemoglobin A1c  Depression, recurrent (HCC) -     Venlafaxine  HCl ER; Take 1 capsule (150 mg total) by mouth every morning. (Patient taking differently: Take 150 mg by mouth every morning. PRN)  Dispense: 90 capsule; Refill: 1  Chronic right shoulder pain Assessment & Plan:  Chronic now acute Xray ordered awaiting results will follow up. I explained to the patient that non-pharmacological interventions include the application of ice or heat, rest, and recommended range of motion exercises along with gentle stretching. For pain management, Tylenol was advised. The patient was instructed to follow up if symptoms worsen or persist. The patient verbalized understanding of the care plan, and all questions were answered.   Orders: -     DG Shoulder Right; Future  Essential hypertension, benign Assessment & Plan: Vitals:   05/25/24 1524  BP: 110/66   Continue Benazepril  20 mg daily  Labs ordered. Discussed with  patient to monitor their blood pressure regularly and maintain a heart-healthy diet rich in fruits, vegetables, whole grains, and low-fat dairy, while reducing sodium intake to less than 2,300 mg per day. Regular physical activity, such as 30 minutes of moderate  exercise most days of the week, will help lower blood pressure and improve overall cardiovascular health. Avoiding smoking, limiting alcohol consumption, and managing stress. Take  prescribed medication, & take it as directed and avoid skipping doses. Seek emergency care if your blood pressure is (over 180/100) or you experience chest pain, shortness of breath, or sudden vision changes.Patient verbalizes understanding regarding plan of care and all questions answered.    Other orders -     Doxycycline  Hyclate; Take 1 tablet (100 mg total) by mouth 2 (two) times daily for 7 days.  Dispense: 14 tablet; Refill: 0    Return in about 6 months (around 11/25/2024), or if symptoms worsen or fail to improve, for chronic follow-up.   Hilario Kidd Wilhelmena Falter, FNP

## 2024-05-25 NOTE — Assessment & Plan Note (Signed)
 Chronic now acute Xray ordered awaiting results will follow up. I explained to the patient that non-pharmacological interventions include the application of ice or heat, rest, and recommended range of motion exercises along with gentle stretching. For pain management, Tylenol was advised. The patient was instructed to follow up if symptoms worsen or persist. The patient verbalized understanding of the care plan, and all questions were answered.

## 2024-05-25 NOTE — Patient Instructions (Signed)

## 2024-05-25 NOTE — Assessment & Plan Note (Signed)
 Vitals:   05/25/24 1524  BP: 110/66   Continue Benazepril  20 mg daily  Labs ordered. Discussed with  patient to monitor their blood pressure regularly and maintain a heart-healthy diet rich in fruits, vegetables, whole grains, and low-fat dairy, while reducing sodium intake to less than 2,300 mg per day. Regular physical activity, such as 30 minutes of moderate exercise most days of the week, will help lower blood pressure and improve overall cardiovascular health. Avoiding smoking, limiting alcohol consumption, and managing stress. Take  prescribed medication, & take it as directed and avoid skipping doses. Seek emergency care if your blood pressure is (over 180/100) or you experience chest pain, shortness of breath, or sudden vision changes.Patient verbalizes understanding regarding plan of care and all questions answered.

## 2024-05-27 LAB — CBC WITH DIFFERENTIAL/PLATELET
Basophils Absolute: 0.1 x10E3/uL (ref 0.0–0.2)
Basos: 1 %
EOS (ABSOLUTE): 0.3 x10E3/uL (ref 0.0–0.4)
Eos: 4 %
Hematocrit: 36.8 % (ref 34.0–46.6)
Hemoglobin: 11.9 g/dL (ref 11.1–15.9)
Immature Grans (Abs): 0 x10E3/uL (ref 0.0–0.1)
Immature Granulocytes: 0 %
Lymphocytes Absolute: 3 x10E3/uL (ref 0.7–3.1)
Lymphs: 37 %
MCH: 30.7 pg (ref 26.6–33.0)
MCHC: 32.3 g/dL (ref 31.5–35.7)
MCV: 95 fL (ref 79–97)
Monocytes Absolute: 0.8 x10E3/uL (ref 0.1–0.9)
Monocytes: 10 %
Neutrophils Absolute: 3.8 x10E3/uL (ref 1.4–7.0)
Neutrophils: 48 %
Platelets: 262 x10E3/uL (ref 150–450)
RBC: 3.87 x10E6/uL (ref 3.77–5.28)
RDW: 12.4 % (ref 11.7–15.4)
WBC: 8 x10E3/uL (ref 3.4–10.8)

## 2024-05-27 LAB — CMP14+EGFR
ALT: 13 IU/L (ref 0–32)
AST: 17 IU/L (ref 0–40)
Albumin: 4.3 g/dL (ref 3.9–4.9)
Alkaline Phosphatase: 103 IU/L (ref 44–121)
BUN/Creatinine Ratio: 13 (ref 12–28)
BUN: 10 mg/dL (ref 8–27)
Bilirubin Total: 0.7 mg/dL (ref 0.0–1.2)
CO2: 19 mmol/L — ABNORMAL LOW (ref 20–29)
Calcium: 9.2 mg/dL (ref 8.7–10.3)
Chloride: 107 mmol/L — ABNORMAL HIGH (ref 96–106)
Creatinine, Ser: 0.8 mg/dL (ref 0.57–1.00)
Globulin, Total: 2.4 g/dL (ref 1.5–4.5)
Glucose: 96 mg/dL (ref 70–99)
Potassium: 3.9 mmol/L (ref 3.5–5.2)
Sodium: 142 mmol/L (ref 134–144)
Total Protein: 6.7 g/dL (ref 6.0–8.5)
eGFR: 83 mL/min/1.73 (ref >59)

## 2024-05-27 LAB — HEMOGLOBIN A1C
Est. average glucose Bld gHb Est-mCnc: 120 mg/dL
Hgb A1c MFr Bld: 5.8 % — ABNORMAL HIGH (ref 4.8–5.6)

## 2024-05-27 LAB — LIPID PANEL
Chol/HDL Ratio: 2.4 ratio (ref 0.0–4.4)
Cholesterol, Total: 137 mg/dL (ref 100–199)
HDL: 58 mg/dL (ref >39)
LDL Chol Calc (NIH): 66 mg/dL (ref 0–99)
Triglycerides: 62 mg/dL (ref 0–149)
VLDL Cholesterol Cal: 13 mg/dL (ref 5–40)

## 2024-05-27 LAB — TSH+FREE T4
Free T4: 1.12 ng/dL (ref 0.82–1.77)
TSH: 0.069 u[IU]/mL — ABNORMAL LOW (ref 0.450–4.500)

## 2024-05-27 LAB — VITAMIN D 25 HYDROXY (VIT D DEFICIENCY, FRACTURES): Vit D, 25-Hydroxy: 53.3 ng/mL (ref 30.0–100.0)

## 2024-05-31 ENCOUNTER — Other Ambulatory Visit: Payer: Self-pay | Admitting: Family Medicine

## 2024-05-31 ENCOUNTER — Ambulatory Visit: Payer: Self-pay | Admitting: Family Medicine

## 2024-05-31 DIAGNOSIS — E0500 Thyrotoxicosis with diffuse goiter without thyrotoxic crisis or storm: Secondary | ICD-10-CM

## 2024-05-31 MED ORDER — ATORVASTATIN CALCIUM 20 MG PO TABS: 20.0000 mg | ORAL_TABLET | Freq: Every day | ORAL | 3 refills | Status: AC

## 2024-05-31 MED ORDER — METHIMAZOLE 5 MG PO TABS: 5.0000 mg | ORAL_TABLET | Freq: Every day | ORAL | 0 refills | Status: AC

## 2024-08-16 ENCOUNTER — Encounter (INDEPENDENT_AMBULATORY_CARE_PROVIDER_SITE_OTHER): Payer: Self-pay | Admitting: Gastroenterology

## 2024-11-16 ENCOUNTER — Other Ambulatory Visit: Payer: Self-pay | Admitting: Family Medicine

## 2024-11-16 DIAGNOSIS — F339 Major depressive disorder, recurrent, unspecified: Secondary | ICD-10-CM

## 2024-11-16 DIAGNOSIS — I10 Essential (primary) hypertension: Secondary | ICD-10-CM
